# Patient Record
Sex: Male | Born: 1965 | Race: White | Hispanic: No | Marital: Married | Smoking: Never smoker
Health system: Southern US, Community
[De-identification: ages and names within clinical notes are randomized; demographics above are authoritative.]

## PROBLEM LIST (undated history)

## (undated) DIAGNOSIS — Z9289 Personal history of other medical treatment: Secondary | ICD-10-CM

## (undated) DIAGNOSIS — Z8249 Family history of ischemic heart disease and other diseases of the circulatory system: Secondary | ICD-10-CM

## (undated) DIAGNOSIS — N529 Male erectile dysfunction, unspecified: Secondary | ICD-10-CM

## (undated) DIAGNOSIS — E782 Mixed hyperlipidemia: Secondary | ICD-10-CM

## (undated) DIAGNOSIS — J309 Allergic rhinitis, unspecified: Secondary | ICD-10-CM

## (undated) DIAGNOSIS — E669 Obesity, unspecified: Secondary | ICD-10-CM

## (undated) DIAGNOSIS — I10 Essential (primary) hypertension: Secondary | ICD-10-CM

## (undated) DIAGNOSIS — T7840XA Allergy, unspecified, initial encounter: Secondary | ICD-10-CM

## (undated) DIAGNOSIS — L719 Rosacea, unspecified: Secondary | ICD-10-CM

## (undated) DIAGNOSIS — E785 Hyperlipidemia, unspecified: Secondary | ICD-10-CM

## (undated) DIAGNOSIS — E559 Vitamin D deficiency, unspecified: Secondary | ICD-10-CM

## (undated) HISTORY — DX: Family history of ischemic heart disease and other diseases of the circulatory system: Z82.49

## (undated) HISTORY — DX: Vitamin D deficiency, unspecified: E55.9

## (undated) HISTORY — DX: Personal history of other medical treatment: Z92.89

## (undated) HISTORY — DX: Hyperlipidemia, unspecified: E78.5

## (undated) HISTORY — DX: Male erectile dysfunction, unspecified: N52.9

## (undated) HISTORY — DX: Essential (primary) hypertension: I10

## (undated) HISTORY — DX: Mixed hyperlipidemia: E78.2

## (undated) HISTORY — DX: Allergic rhinitis, unspecified: J30.9

## (undated) HISTORY — PX: LUMBAR DISC SURGERY: SHX700

## (undated) HISTORY — DX: Rosacea, unspecified: L71.9

## (undated) HISTORY — DX: Obesity, unspecified: E66.9

## (undated) HISTORY — DX: Allergy, unspecified, initial encounter: T78.40XA

---

## 1998-12-08 ENCOUNTER — Ambulatory Visit (HOSPITAL_COMMUNITY): Admission: RE | Admit: 1998-12-08 | Discharge: 1998-12-08 | Payer: Self-pay | Admitting: Cardiology

## 1998-12-08 ENCOUNTER — Encounter: Payer: Self-pay | Admitting: Cardiology

## 2003-02-10 ENCOUNTER — Encounter: Payer: Self-pay | Admitting: Family Medicine

## 2003-02-10 ENCOUNTER — Encounter: Admission: RE | Admit: 2003-02-10 | Discharge: 2003-02-10 | Payer: Self-pay | Admitting: Family Medicine

## 2003-09-15 ENCOUNTER — Encounter: Admission: RE | Admit: 2003-09-15 | Discharge: 2003-09-15 | Payer: Self-pay | Admitting: Family Medicine

## 2006-04-15 ENCOUNTER — Ambulatory Visit: Payer: Self-pay | Admitting: Family Medicine

## 2006-06-03 ENCOUNTER — Ambulatory Visit: Payer: Self-pay | Admitting: Family Medicine

## 2006-08-20 ENCOUNTER — Ambulatory Visit: Payer: Self-pay | Admitting: Family Medicine

## 2006-09-03 ENCOUNTER — Ambulatory Visit (HOSPITAL_COMMUNITY): Admission: RE | Admit: 2006-09-03 | Discharge: 2006-09-04 | Payer: Self-pay | Admitting: Neurosurgery

## 2007-11-11 ENCOUNTER — Ambulatory Visit: Payer: Self-pay | Admitting: Family Medicine

## 2008-02-02 ENCOUNTER — Ambulatory Visit (HOSPITAL_COMMUNITY): Admission: RE | Admit: 2008-02-02 | Discharge: 2008-02-03 | Payer: Self-pay | Admitting: Neurosurgery

## 2009-11-18 ENCOUNTER — Ambulatory Visit: Payer: Self-pay | Admitting: Family Medicine

## 2009-12-16 DIAGNOSIS — Z9289 Personal history of other medical treatment: Secondary | ICD-10-CM

## 2009-12-16 HISTORY — DX: Personal history of other medical treatment: Z92.89

## 2011-02-20 NOTE — Op Note (Signed)
NAMEFINLEY, Patrick Byrd           ACCOUNT NO.:  [id number]   MEDICAL RECORD NO.:  [id number]          PATIENT TYPE:  OIB   LOCATION:  3533                         FACILITY:  [facility]   PHYSICIAN:  Clydene Fake, M.D.  DATE OF BIRTH:  Oct 16, 1965   DATE OF PROCEDURE:  02/02/2008  DATE OF DISCHARGE:                               OPERATIVE REPORT   PREOPERATIVE DIAGNOSIS:  Herniated nucleus pulposus, spondylosis, right  L4.   POSTOPERATIVE DIAGNOSIS:  Herniated nucleus pulposus,  spondylosis,  right L4.   PROCEDURE:  Right L4-L5 semi-hemilaminectomy and discectomy,  microdissection with microscope.   SURGEON:  Clydene Fake, M.D.   ASSISTANT:  Stefani Dama, M.D.   ANESTHESIA:  General endotracheal tube anesthesia.   ESTIMATED BLOOD LOSS:  Minimal.   DRAINS:  None.   COMPLICATIONS:  None.   REASON FOR PROCEDURE:  The patient is a 45 year old gentleman who has  had back and right leg pain and numbness improving slightly with  __________ but not lasting.  Symptoms have been progressing.  He had a  prior left-sided discectomy at 4-5 level.  An MRI was done showing  central to right-sided disc herniation.   PROCEDURE IN DETAIL:  The patient was brought to the operating room,  general anesthesia was induced.  The patient was placed in the prone  position on a Wilson frame with all pressure points padded.  The patient  was prepped and draped with sterile technique.  The site of  incision  was injected with 10 mL of 1% lidocaine with epinephrine.  An incision  was then made at the site of the previous scar in the midline over the  lumbar spine, incision taken down to the lumbar fascia.  Hemostasis was  obtained with Bovie cauterization.  The fascia was incised on the right  side and subperiosteal dissection was done at the L4-L5 spinous process  and lamina down to the facets.  The self retaining retractor was placed  and marker was placed in the interspace.  X-ray was obtained  confirming  our positioning at L4-L5.  Microscope was brought in for  microdissection.  At this point high-speed drill was used to start semi-  hemilaminectomy and medial facetectomy.  It was completed with Kerrison  punches and ligamentum flavum was removed.  Foraminotomy was done over  the L5 root to explore the epidural space.  Extruded fragment of disk  found up under the L5 root.  This was removed. As we explored the  epidural space, we found a large linear rent.  The disk space was then  incised with a 15-blade  and discectomy continued with pituitary  rongeurs and curettes.  When we were finished, we had good decompression  of the central canal and in L5 nerve root, we used osteotome to remove  some osteophytes under the L5 root hoping to decompress it further.  We  irrigated with antibiotic solution, we  got hemostasis with Gelfoam and  Thrombin.  This was irrigated out.  We checked the nerve root and we had  good decompression of the L4 and L5 roots and the central  canal.  The  retractors were removed.  Fascia was closed with 0-Vicryl interrupted sutures, subcutaneous tissue  closed with same, skin closed with Benzoin and Steri-Strips dressings  was applied.  The patient was placed back in the supine position, awoken  from anesthesia, and transferred to the recovery room in stable  condition.           ______________________________  Clydene Fake, M.D.     JRH/MEDQ  D:  02/02/2008  T:  02/03/2008  Job:  [id number]

## 2011-02-23 NOTE — Op Note (Signed)
Patrick Byrd, Patrick Byrd           ACCOUNT NO.:  [id number]   MEDICAL RECORD NO.:  [id number]          PATIENT TYPE:  AMB   LOCATION:  SDS                          FACILITY:  [facility]   PHYSICIAN:  Clydene Fake, M.D.  DATE OF BIRTH:  [birth year]   DATE OF PROCEDURE:  09/03/2006  DATE OF DISCHARGE:                                 OPERATIVE REPORT   DIAGNOSES:  Herniated nucleus pulposus, spondylosis, L4-5, and left-sided  radiculopathy.   POSTOPERATIVE DIAGNOSES:  Herniated nucleus pulposus, spondylosis, L4-5, and  left-sided radiculopathy.   PROCEDURE:  L4-5 semihemilaminectomy and diskectomy, microdissection with  the microscope.   SURGEON:  Clydene Fake, M.D.   ASSISTANT:  Hewitt Shorts, M.D.   ANESTHESIA:  General endotracheal tube anesthesia.   ESTIMATED BLOOD LOSS:  Minimal.   BLOOD GIVEN:  None.   DRAINS:  None.   COMPLICATIONS:  None.   REASON FOR PROCEDURE:  Patient is a 45 year old gentleman, who has had back  and left leg pain, numbness, that has been going on 4 to 5 weeks or so.  An  MRI was done, showing spondylytic changes, worse at the 4-5 level.  Huge  free fragments of disk herniation coming central, and an annular tear, going  more to the left side, causing canal stenosis and compression of the left-  sided nerve root.  The patient had decreased sensation, left S1 and L4  distribution.  Positive straight leg raise.  Patient continued to have  problems despite a steroids and other nonsurgical management.  Patient  brought to surgery for diskectomy and decompression of the nerve roots.   PROCEDURE IN DETAIL:  Patient was brought to the operating room.  After  general anesthesia was induced, the patient was placed in a prone position  on a Wilson frame with all pressure points padded.  The patient was prepped  and draped in a sterile fashion.  The site of incision was injected with 10  cc of 1% lidocaine with epinephrine.  A needle was placed in  the  intervertebral interspace.  X-ray was obtained and the x-ray showed we were  pointing towards the 4-5 spinous process.  Incision was then made centered  over where the needle was.  The incision was taken down to the fascia.  Hemostasis was obtained with Bovie cauterization.  The fascia was incised on  the left side and subperiosteal dissection performed over the L4 spinous  processes and lamina out to the facet.  A marker was placed in the  interspace and another x-ray was obtained, confirming our positioning at L4-  5.  The microscope was brought in for microdissection at this point, and a  high-speed drill was used to start a semihemilaminectomy and a medial  facetectomy, and was continued and completed with Kerrison punches.  Ligamentum flavum was removed, and we removed the top of the L5 lamina and  did a foraminotomy over the L5 roots.  We explored the epidural space with  nerve hooks and found the disk bulging below this on the body of L5, a large  epidural mass.  As we carefully dissected  that out, it was a large disk  herniation, and we were able to remove this huge free fragment, removing  this with hooks and pituitary rongeurs.  After this, the dura was relaxed.  There was no further compression of the nerve roots or the central canal.  We explored the disk space and there was an annular bulge and possible tear  palpated with the nerve hook very medially, maybe more towards the right  side, but no hole here on the left side where we could visualize.  It was  felt best to leave the disk space intact, since there were decompression of  the canal and the left-sided nerve root.  Hemostasis was obtained with  Gelfoam and thrombin and bipolar cauterization.  The Gelfoam was irrigated  out.  We irrigated with antibiotic solution.  There was good hemostasis and  the retractors were removed.  Fascia was closed with 0 Vicryl interrupted  sutures.  The subcutaneous tissues were closed  with 0, 2-0 and 3-0 Vicryl  interrupted sutures, and the skin closed with benzoin and Steri-Strips.  A  dressing was placed.  The patient was placed back into a supine position,  awakened from anesthesia and transferred to the recovery room in stable  condition.           ______________________________  Clydene Fake, M.D.     JRH/MEDQ  D:  09/03/2006  T:  09/03/2006  Job:  [id number]

## 2011-06-15 ENCOUNTER — Encounter: Payer: Self-pay | Admitting: Family Medicine

## 2011-07-03 LAB — URINALYSIS, ROUTINE W REFLEX MICROSCOPIC
Bilirubin Urine: NEGATIVE
Glucose, UA: NEGATIVE
Hgb urine dipstick: NEGATIVE
Ketones, ur: NEGATIVE
Nitrite: NEGATIVE
Protein, ur: NEGATIVE
Specific Gravity, Urine: 1.034 — ABNORMAL HIGH
Urobilinogen, UA: 0.2
pH: 5.5

## 2011-07-03 LAB — CBC
HCT: 45.8
Hemoglobin: 15.5
MCHC: 33.9
MCV: 90.7
Platelets: 262
RBC: 5.05
RDW: 13.1
WBC: 8

## 2011-07-03 LAB — BASIC METABOLIC PANEL
BUN: 24 — ABNORMAL HIGH
CO2: 26
Calcium: 10.3
Chloride: 105
Creatinine, Ser: 0.91
GFR calc Af Amer: >60
GFR calc non Af Amer: >60
Glucose, Bld: 98
Potassium: 4.5
Sodium: 140

## 2011-07-03 LAB — PROTIME-INR
INR: 0.9
Prothrombin Time: 12.1

## 2011-07-03 LAB — APTT: aPTT: 28

## 2012-03-17 ENCOUNTER — Ambulatory Visit: Payer: Self-pay

## 2013-01-18 ENCOUNTER — Encounter (HOSPITAL_COMMUNITY): Payer: Self-pay | Admitting: *Deleted

## 2013-01-18 ENCOUNTER — Emergency Department (HOSPITAL_COMMUNITY)
Admission: EM | Admit: 2013-01-18 | Discharge: 2013-01-18 | Disposition: A | Discharge status: Home / Self Care | Payer: BC Managed Care – PPO | Attending: Emergency Medicine | Admitting: Emergency Medicine

## 2013-01-18 DIAGNOSIS — Y9389 Activity, other specified: Secondary | ICD-10-CM | POA: Insufficient documentation

## 2013-01-18 DIAGNOSIS — Z862 Personal history of diseases of the blood and blood-forming organs and certain disorders involving the immune mechanism: Secondary | ICD-10-CM | POA: Insufficient documentation

## 2013-01-18 DIAGNOSIS — Z87448 Personal history of other diseases of urinary system: Secondary | ICD-10-CM | POA: Insufficient documentation

## 2013-01-18 DIAGNOSIS — Z23 Encounter for immunization: Secondary | ICD-10-CM | POA: Insufficient documentation

## 2013-01-18 DIAGNOSIS — Z7982 Long term (current) use of aspirin: Secondary | ICD-10-CM | POA: Insufficient documentation

## 2013-01-18 DIAGNOSIS — S61209A Unspecified open wound of unspecified finger without damage to nail, initial encounter: Secondary | ICD-10-CM | POA: Insufficient documentation

## 2013-01-18 DIAGNOSIS — Y929 Unspecified place or not applicable: Secondary | ICD-10-CM | POA: Insufficient documentation

## 2013-01-18 DIAGNOSIS — E669 Obesity, unspecified: Secondary | ICD-10-CM | POA: Insufficient documentation

## 2013-01-18 DIAGNOSIS — Z8639 Personal history of other endocrine, nutritional and metabolic disease: Secondary | ICD-10-CM | POA: Insufficient documentation

## 2013-01-18 DIAGNOSIS — W260XXA Contact with knife, initial encounter: Secondary | ICD-10-CM | POA: Insufficient documentation

## 2013-01-18 DIAGNOSIS — S61219A Laceration without foreign body of unspecified finger without damage to nail, initial encounter: Secondary | ICD-10-CM

## 2013-01-18 DIAGNOSIS — Z872 Personal history of diseases of the skin and subcutaneous tissue: Secondary | ICD-10-CM | POA: Insufficient documentation

## 2013-01-18 MED ORDER — TETANUS-DIPHTH-ACELL PERTUSSIS 5-2.5-18.5 LF-MCG/0.5 IM SUSP
0.5000 mL | Freq: Once | INTRAMUSCULAR | Status: AC
  Administered 2013-01-18: 0.5 mL via INTRAMUSCULAR
  Filled 2013-01-18: qty 0.5

## 2013-01-18 NOTE — ED Provider Notes (Signed)
History    This chart was scribed for non-physician practitioner working with Shelda Jakes, MD by Frederik Pear, ED Scribe. This patient was seen in room TR10C/TR10C and the patient's care was started at 2120.   CSN: [id number]  Arrival date & time 01/18/13  2001   First MD Initiated Contact with Patient 01/18/13 2120      Chief Complaint  Patient presents with  . Extremity Laceration    (Consider location/radiation/quality/duration/timing/severity/associated sxs/prior treatment) The history is provided by the patient and medical records. No language interpreter was used.    Patrick Byrd is a 47 y.o. male who presents to the Emergency Department with a chief complaint of sudden onset, constant, moderate laceration with 5/10 pain to the left index that began at 19:30 when he cut his finger with a knife while he was preparing lobster for dinner. In ED, the bleeding is controlled. He denies any allergies to medications.   Past Medical History  Diagnosis Date  . Dyslipidemia   . ED (erectile dysfunction)   . Obesity   . Rosacea   . Hypercholesteremia     Past Surgical History  Procedure Laterality Date  . Lumbar disc surgery      [facility]    Family History  Problem Relation Age of Onset  . Hypertension Father   . Arthritis Sister   . Cancer Paternal Grandmother   . Arthritis Paternal Grandmother   . Stroke Paternal Grandfather     History  Substance Use Topics  . Smoking status: Never Smoker   . Smokeless tobacco: Not on file  . Alcohol Use: Yes     Comment: occ      Review of Systems A complete 10 system review of systems was obtained and all systems are negative except as noted in the HPI and PMH.   Allergies  Review of patient's allergies indicates no known allergies.  Home Medications   Current Outpatient Rx  Name  Route  Sig  Dispense  Refill  . aspirin 81 MG tablet   Oral   Take 81 mg by mouth daily.           . benzoyl peroxide 5 %  gel   Topical   Apply topically daily.           . Multiple Vitamins-Minerals (MULTIVITAMIN WITH MINERALS) tablet   Oral   Take 1 tablet by mouth daily.             BP 148/86  Pulse 86  Temp(Src) 98.3 F (36.8 C) (Oral)  Resp 16  SpO2 96%  Physical Exam  Nursing note and vitals reviewed. Constitutional: He is oriented to person, place, and time. He appears well-developed and well-nourished. No distress.  HENT:  Head: Normocephalic and atraumatic.  Eyes: EOM are normal. Pupils are equal, round, and reactive to light.  Neck: Normal range of motion. Neck supple. No tracheal deviation present.  Cardiovascular: Normal rate.   Pulmonary/Chest: Effort normal. No respiratory distress.  Abdominal: Soft. He exhibits no distension.  Musculoskeletal: Normal range of motion. He exhibits no edema.  Neurological: He is alert and oriented to person, place, and time.  Skin: Skin is warm and dry. Laceration noted.  Left index finger 1 cm laceration to the lateral aspect of the DIP.  Psychiatric: He has a normal mood and affect. His behavior is normal.    ED Course  Procedures (including critical care time)  DIAGNOSTIC STUDIES: Oxygen Saturation is 96% on room air, adequate by  my interpretation.    COORDINATION OF CARE:  21:27- Discussed planned course of treatment with the patient, including repairing the laceration and a TDap, who is agreeable at this time.  21:45- Medication Orders- TDap (Boostrix) injection 0.27mL-once.  LACERATION REPAIR Performed by: Ivar Drape PA-C Consent: Verbal consent obtained. Risks and benefits: risks, benefits and alternatives were discussed Patient identity confirmed: provided demographic data Time out performed prior to procedure Prepped and Draped in normal sterile fashion Wound explored Laceration Location: left index finger to the lateral aspect of the DIP Laceration Length: 1 cm No Foreign Bodies seen or palpated Anesthesia: local  infiltration Local anesthetic: lidocaine 2% without epinephrine Anesthetic total: 3 ml Irrigation method: syringe Amount of cleaning: standard Skin closure: 4-0 gut Number of sutures or staples: 4 Technique: simple Patient tolerance: Patient tolerated the procedure well with no immediate complications.   Labs Reviewed - No data to display No results found.   1. Laceration of finger, initial encounter       MDM    I personally performed the services described in this documentation, which was scribed in my presence. The recorded information has been reviewed and is accurate.         Roxy Horseman, PA-C 01/19/13 [phone]

## 2013-01-18 NOTE — ED Notes (Signed)
Pt states he was preparing lobster for dinner and cut his left pointer finger on the shell approx 25 min PTA.  Bleeding controlled at this time.

## 2013-01-20 NOTE — ED Provider Notes (Signed)
Medical screening examination/treatment/procedure(s) were performed by non-physician practitioner and as supervising physician I was immediately available for consultation/collaboration.   Shelda Jakes, MD 01/20/13 1311

## 2013-03-25 ENCOUNTER — Encounter: Payer: Self-pay | Admitting: Medical

## 2013-03-25 ENCOUNTER — Other Ambulatory Visit: Payer: Self-pay | Admitting: Medical

## 2013-03-25 ENCOUNTER — Other Ambulatory Visit: Payer: Self-pay | Admitting: Cardiovascular Disease

## 2013-03-25 ENCOUNTER — Ambulatory Visit (INDEPENDENT_AMBULATORY_CARE_PROVIDER_SITE_OTHER): Payer: BC Managed Care – PPO | Admitting: Medical

## 2013-03-25 VITALS — BP 120/80 | HR 80 | Temp 97.3°F | Resp 16 | Ht 74.0 in | Wt 241.0 lb

## 2013-03-25 DIAGNOSIS — E559 Vitamin D deficiency, unspecified: Secondary | ICD-10-CM

## 2013-03-25 DIAGNOSIS — Z8249 Family history of ischemic heart disease and other diseases of the circulatory system: Secondary | ICD-10-CM

## 2013-03-25 DIAGNOSIS — Z0000 Encounter for general adult medical examination without abnormal findings: Secondary | ICD-10-CM

## 2013-03-25 DIAGNOSIS — E782 Mixed hyperlipidemia: Secondary | ICD-10-CM

## 2013-03-25 DIAGNOSIS — E669 Obesity, unspecified: Secondary | ICD-10-CM

## 2013-03-25 LAB — COMPREHENSIVE METABOLIC PANEL
ALT: 30 U/L (ref 0–53)
AST: 25 U/L (ref 0–37)
Albumin: 4.5 g/dL (ref 3.5–5.2)
Alkaline Phosphatase: 79 U/L (ref 39–117)
BUN: 14 mg/dL (ref 6–23)
CO2: 25 mEq/L (ref 19–32)
Calcium: 9.6 mg/dL (ref 8.4–10.5)
Chloride: 103 mEq/L (ref 96–112)
Creat: 0.98 mg/dL (ref 0.50–1.35)
Glucose, Bld: 89 mg/dL (ref 70–99)
Potassium: 4.2 mEq/L (ref 3.5–5.3)
Sodium: 137 mEq/L (ref 135–145)
Total Bilirubin: 0.9 mg/dL (ref 0.3–1.2)
Total Protein: 7.2 g/dL (ref 6.0–8.3)

## 2013-03-25 LAB — CBC
HCT: 44.1 % (ref 39.0–52.0)
Hemoglobin: 15.2 g/dL (ref 13.0–17.0)
MCH: 30.1 pg (ref 26.0–34.0)
MCHC: 34.5 g/dL (ref 30.0–36.0)
MCV: 87.3 fL (ref 78.0–100.0)
Platelets: 251 10*3/uL (ref 150–400)
RBC: 5.05 MIL/uL (ref 4.22–5.81)
RDW: 13.6 % (ref 11.5–15.5)
WBC: 6.3 10*3/uL (ref 4.0–10.5)

## 2013-03-25 MED ORDER — SILDENAFIL CITRATE 100 MG PO TABS: 100.0000 mg | ORAL_TABLET | Freq: Every day | ORAL | Status: DC | PRN

## 2013-03-25 NOTE — Progress Notes (Signed)
Subjective:   HPI  Patrick Byrd is a 47 y.o. male who presents for a complete physical.  Sees cardiology given strong family history of heart disease.  Is on medication for high cholesterol.  No prior personal hx/o heart disease, but he has had cardiac testing prior.  Preventative care: Last ophthalmology visit:n/a Last dental visit:yes- Dr. Garner Nash Last colonoscopy:n/a Last prostate exam:  Last EKG:11/11/2007  Prior vaccinations: TD or Tdap:01/18/2013 Influenza:n/a Pneumococcal:n/a Shingles/Zostavax:n/a  Advanced directive:n/a Health care power of attorney:n/a Living will:n/a  Concerns: None, needs viagra refilled, labs.  Does well on Viagra.    Past Medical History  Diagnosis Date  . ED (erectile dysfunction)   . Obesity   . Rosacea     sees dermatology  . Allergic rhinitis   . Family history of premature CAD   . History of cardiovascular stress test 12/16/09    Myoview, nonischemic; Dr. Allyson Sabal  . Mixed dyslipidemia   . Vitamin D deficiency     Past Surgical History  Procedure Laterality Date  . Lumbar disc surgery      [facility]    Family History  Problem Relation Age of Onset  . Hypertension Father   . Heart disease Father 63    CABG  . Arthritis Sister   . Cancer Paternal Grandmother     brain  . Arthritis Paternal Grandmother   . Stroke Paternal Grandfather   . Cancer Mother     skin  . Diabetes Paternal Aunt   . Heart disease Paternal Uncle 27    MI  . Heart disease Paternal Uncle 26    MI    History   Social History  . Marital Status: Married    Spouse Name: N/A    Number of Children: N/A  . Years of Education: N/A   Occupational History  . Not on file.   Social History Main Topics  . Smoking status: Never Smoker   . Smokeless tobacco: Not on file  . Alcohol Use: 0.0 oz/week    0 Shots of liquor per week     Comment: occ  . Drug Use: No  . Sexually Active: Not on file   Other Topics Concern  . 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  Exercise - walk, cycling some.      Current Outpatient Prescriptions on File Prior to Visit  Medication Sig Dispense Refill  . aspirin 81 MG tablet Take 81 mg by mouth daily.        Marland Kitchen glucosamine-chondroitin 500-400 MG tablet Take 1 tablet by mouth 3 (three) times daily.      . Multiple Vitamins-Minerals (MULTIVITAMIN WITH MINERALS) tablet Take 1 tablet by mouth daily.        Marland Kitchen omega-3 acid ethyl esters (LOVAZA) 1 G capsule Take 1 g by mouth daily.      . rosuvastatin (CRESTOR) 10 MG tablet Take 10 mg by mouth daily.       No current facility-administered medications on file prior to visit.    No Known Allergies   Reviewed their medical, surgical, family, social, medication, and allergy history and updated chart as appropriate.    Review of Systems Constitutional: -fever, -chills, -sweats, -unexpected weight change, -decreased appetite, -fatigue Allergy: -sneezing, -itching, -congestion Dermatology: -changing moles, --rash, -lumps ENT: -runny nose, -ear pain, -sore throat, -hoarseness, -sinus pain, -teeth pain, - ringing in ears, -hearing loss, -nosebleeds Cardiology: -chest pain, -palpitations, -  swelling, -difficulty breathing when lying flat, -waking up short of breath Respiratory: -cough, -shortness of breath, -difficulty breathing with exercise or exertion, -wheezing, -coughing up blood Gastroenterology: -abdominal pain, -nausea, -vomiting, -diarrhea, -constipation, -blood in stool, -changes in bowel movement, -difficulty swallowing or eating Hematology: -bleeding, -bruising  Musculoskeletal: -joint aches, -muscle aches, -joint swelling, -back pain, -neck pain, -cramping, -changes in gait Ophthalmology: denies vision changes, eye redness, itching, discharge Urology: -burning with urination, -difficulty urinating, -blood in urine, -urinary frequency, -urgency, -incontinence Neurology:  -headache, -weakness, -tingling, -numbness, -memory loss, -falls, -dizziness Psychology: -depressed mood, -agitation, -sleep problems     Objective:   Physical Exam  Filed Vitals:   03/25/13 0933  BP: 120/80  Pulse: 80  Temp: 97.3 F (36.3 C)  Resp: 16    General appearance: alert, no distress, WD/WN, white male Skin: scattered benign appearing lesions, no worrisome lesions HEENT: normocephalic, conjunctiva/corneas normal, sclerae anicteric, PERRLA, EOMi, nares patent, no discharge or erythema, pharynx normal Oral cavity: MMM, tongue normal, teeth in good repair Neck: supple, no lymphadenopathy, no thyromegaly, no masses, normal ROM, no bruits Chest: non tender, normal shape and expansion Heart: RRR, normal S1, S2, no murmurs Lungs: CTA bilaterally, no wheezes, rhonchi, or rales Abdomen: +bs, soft, non tender, non distended, no masses, no hepatomegaly, no splenomegaly, no bruits Back: non tender, normal ROM, no scoliosis Musculoskeletal: upper extremities non tender, no obvious deformity, normal ROM throughout, lower extremities non tender, no obvious deformity, normal ROM throughout Extremities: no edema, no cyanosis, no clubbing Pulses: 2+ symmetric, upper and lower extremities, normal cap refill Neurological: alert, oriented x 3, CN2-12 intact, strength normal upper extremities and lower extremities, sensation normal throughout, DTRs 2+ throughout, no cerebellar signs, gait normal Psychiatric: normal affect, behavior normal, pleasant  GU: normal male external genitalia, nontender, no masses, no hernia, no lymphadenopathy Rectal: deferred   Assessment and Plan :      Encounter Diagnoses  Name Primary?  . Routine general medical examination at a health care facility Yes  . Unspecified vitamin D deficiency   . Mixed dyslipidemia   . Family history of premature CAD   . Obesity, unspecified     Physical exam - discussed healthy lifestyle, diet, exercise, preventative  care, vaccinations, and addressed their concerns.  Reviewed prior cardiology records, chart records, updated chart accordingly.   He will take order for labs since he is nonfasting today, and have labs down next month when he sees cardiology for lipo profile.  Advised weight loss, lifestyle changes, routine ophthalmology and dentist and dermatology f/u. Follow-up pending labs

## 2013-03-25 NOTE — Addendum Note (Signed)
Addended by: Jac Canavan on: 03/25/2013 09:38 PM   Modules accepted: Orders

## 2013-03-26 ENCOUNTER — Other Ambulatory Visit: Payer: Self-pay | Admitting: Family Medicine

## 2013-03-26 LAB — POCT URINALYSIS DIPSTICK
Bilirubin, UA: NEGATIVE
Blood, UA: NEGATIVE
Glucose, UA: NEGATIVE
Ketones, UA: NEGATIVE
Leukocytes, UA: NEGATIVE
Nitrite, UA: NEGATIVE
Protein, UA: NEGATIVE
Spec Grav, UA: 1.01
Urobilinogen, UA: NEGATIVE
pH, UA: 5

## 2013-03-26 LAB — NMR LIPOPROFILE WITH LIPIDS
Cholesterol, Total: 173 mg/dL (ref <200)
HDL Particle Number: 42.6 umol/L (ref >=30.5)
HDL Size: 8.9 nm — ABNORMAL LOW (ref >=9.2)
HDL-C: 55 mg/dL (ref >=40)
LDL (calc): 80 mg/dL (ref <100)
LDL Particle Number: 1128 nmol/L — ABNORMAL HIGH (ref <1000)
LDL Size: 20.3 nm — ABNORMAL LOW (ref >20.5)
LP-IR Score: 61 — ABNORMAL HIGH (ref <=45)
Large HDL-P: 4.8 umol/L (ref >=4.8)
Large VLDL-P: 6.3 nmol/L — ABNORMAL HIGH (ref <=2.7)
Small LDL Particle Number: 748 nmol/L — ABNORMAL HIGH (ref <=527)
Triglycerides: 192 mg/dL — ABNORMAL HIGH (ref <150)
VLDL Size: 48 nm — ABNORMAL HIGH (ref <=46.6)

## 2013-03-26 LAB — VITAMIN D 25 HYDROXY (VIT D DEFICIENCY, FRACTURES): Vit D, 25-Hydroxy: 35 ng/mL (ref 30–89)

## 2013-03-26 MED ORDER — SILDENAFIL CITRATE 100 MG PO TABS: 100.0000 mg | ORAL_TABLET | Freq: Every day | ORAL | Status: DC | PRN

## 2013-03-26 NOTE — Telephone Encounter (Signed)
I called over to [facility] and cancelled the Rx for Viagra and re-sent the [facility] [place] per the patients request. CLS

## 2013-04-01 ENCOUNTER — Encounter: Payer: Self-pay | Admitting: Medical

## 2013-04-09 ENCOUNTER — Ambulatory Visit: Payer: Self-pay | Admitting: Medical

## 2013-04-21 ENCOUNTER — Other Ambulatory Visit: Payer: Self-pay | Admitting: *Deleted

## 2013-04-21 MED ORDER — ROSUVASTATIN CALCIUM 10 MG PO TABS: 10.0000 mg | ORAL_TABLET | Freq: Every day | ORAL | Status: DC

## 2013-08-13 ENCOUNTER — Other Ambulatory Visit: Payer: Self-pay

## 2013-10-09 ENCOUNTER — Other Ambulatory Visit: Payer: Self-pay | Admitting: *Deleted

## 2013-10-09 MED ORDER — ROSUVASTATIN CALCIUM 10 MG PO TABS: 10.0000 mg | ORAL_TABLET | Freq: Every day | ORAL | Status: DC

## 2013-11-12 ENCOUNTER — Other Ambulatory Visit: Payer: Self-pay | Admitting: *Deleted

## 2013-11-12 MED ORDER — ROSUVASTATIN CALCIUM 10 MG PO TABS: 10.0000 mg | ORAL_TABLET | Freq: Every day | ORAL | Status: DC

## 2014-06-08 ENCOUNTER — Encounter: Payer: Self-pay | Admitting: *Deleted

## 2014-06-08 ENCOUNTER — Other Ambulatory Visit: Payer: Self-pay | Admitting: *Deleted

## 2014-06-08 ENCOUNTER — Other Ambulatory Visit: Payer: Self-pay

## 2014-06-08 MED ORDER — ROSUVASTATIN CALCIUM 5 MG PO TABS: 5.0000 mg | ORAL_TABLET | Freq: Every day | ORAL | Status: DC

## 2014-06-08 NOTE — Telephone Encounter (Signed)
Rx was sent to pharmacy electronically. 

## 2014-07-03 ENCOUNTER — Encounter: Payer: Self-pay | Admitting: Medical

## 2014-07-08 ENCOUNTER — Telehealth: Payer: Self-pay | Admitting: Cardiovascular Disease

## 2014-07-09 ENCOUNTER — Telehealth: Payer: Self-pay | Admitting: Cardiovascular Disease

## 2014-07-09 NOTE — Telephone Encounter (Signed)
Closed encounter °

## 2014-07-13 MED ORDER — ROSUVASTATIN CALCIUM 10 MG PO TABS: 10.0000 mg | ORAL_TABLET | Freq: Every day | ORAL | Status: DC

## 2014-07-13 NOTE — Telephone Encounter (Signed)
Pt said Patrick Byrd said they still have not heard from you,regarding his cholesterol medicine. Pt did not remember the name of it,please call this in today.Please call to [facility] in [place].

## 2014-07-13 NOTE — Telephone Encounter (Signed)
Rx was sent to pharmacy electronically. Patient states he takes 10mg  crestor.

## 2014-08-03 ENCOUNTER — Encounter: Payer: Self-pay | Admitting: Cardiovascular Disease

## 2014-08-03 ENCOUNTER — Ambulatory Visit (INDEPENDENT_AMBULATORY_CARE_PROVIDER_SITE_OTHER): Payer: BC Managed Care – PPO | Admitting: Cardiovascular Disease

## 2014-08-03 VITALS — BP 132/86 | HR 86 | Ht 74.0 in | Wt 241.4 lb

## 2014-08-03 DIAGNOSIS — Z8249 Family history of ischemic heart disease and other diseases of the circulatory system: Secondary | ICD-10-CM

## 2014-08-03 DIAGNOSIS — Z79899 Other long term (current) drug therapy: Secondary | ICD-10-CM

## 2014-08-03 DIAGNOSIS — E785 Hyperlipidemia, unspecified: Secondary | ICD-10-CM

## 2014-08-03 NOTE — Assessment & Plan Note (Signed)
He is on statin therapy. His last lipid profile performed 03/25/13 revealed LDL of 80. Recheck a lipid and liver profile. He does admit to poor dietary habits.

## 2014-08-03 NOTE — Progress Notes (Signed)
08/03/2014 Patrick Byrd.   [birth year]  [id number]  Primary Physician Wyatt Haste, MD Primary Cardiologist: Lorretta Harp MD Renae Gloss   HPI:  Patrick Byrd is a 48 year old moderately overweight married Caucasian male father of 3 children who I last saw in the office 07/23/12. He has a history of hyperlipidemia and a positive family history of heart disease with a father who had bypass surgery at age 59. He is completely asymptomatic. His last Myoview performed 12/16/09 was nonischemic.   Current Outpatient Prescriptions  Medication Sig Dispense Refill  . Ascorbic Acid (VITAMIN C) 100 MG tablet Take 100 mg by mouth daily.      Marland Kitchen aspirin 81 MG tablet Take 81 mg by mouth daily.      . cetirizine (ZYRTEC) 10 MG tablet Take 10 mg by mouth daily.      Marland Kitchen doxycycline (ADOXA) 50 MG tablet Take 50 mg by mouth. Every other day      . glucosamine-chondroitin 500-400 MG tablet Take 1 tablet by mouth daily.       . Multiple Vitamins-Minerals (MULTIVITAMIN WITH MINERALS) tablet Take 1 tablet by mouth daily.        Marland Kitchen omega-3 acid ethyl esters (LOVAZA) 1 G capsule Take 1 g by mouth daily.      . Omega-3 Fatty Acids (FISH OIL) 1000 MG CAPS Take by mouth.      . rosuvastatin (CRESTOR) 5 MG tablet Take 1 tablet (5 mg total) by mouth daily.  30 tablet  0  . sildenafil (VIAGRA) 100 MG tablet Take 1 tablet (100 mg total) by mouth daily as needed for erectile dysfunction.  10 tablet  5   No current facility-administered medications for this visit.    Allergies  Allergen Reactions  . Statins     History   Social History  . Marital Status: Married    Spouse Name: N/A    Number of Children: N/A  . Years of Education: N/A   Occupational History  . Not on file.   Social History Main Topics  . Smoking status: Unknown If Ever Smoked  . Smokeless tobacco: Not on file  . Alcohol Use: No     Comment: occ  . Drug Use: No  . Sexual Activity: Not on file    Other Topics Concern  . 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  Exercise - walk, cycling some.       Review of Systems: General: negative for chills, fever, night sweats or weight changes.  Cardiovascular: negative for chest pain, dyspnea on exertion, edema, orthopnea, palpitations, paroxysmal nocturnal dyspnea or shortness of breath Dermatological: negative for rash Respiratory: negative for cough or wheezing Urologic: negative for hematuria Abdominal: negative for nausea, vomiting, diarrhea, bright red blood per rectum, melena, or hematemesis Neurologic: negative for visual changes, syncope, or dizziness All other systems reviewed and are otherwise negative except as noted above.    Blood pressure 132/86, pulse 86, height 6\' 2"  (1.88 m), weight 241 lb 6.4 oz (109.498 kg).  General appearance: alert and no distress Neck: no adenopathy, no carotid bruit, no JVD, supple, symmetrical, trachea midline and thyroid not enlarged, symmetric, no tenderness/mass/nodules Lungs: clear to auscultation bilaterally Heart: regular rate and rhythm, S1, S2 normal, no murmur, click, rub or gallop Extremities: extremities normal, atraumatic, no cyanosis or edema  EKG normal sinus rhythm at 86 without ST or T-wave changes  ASSESSMENT AND PLAN:   Hyperlipidemia He is on statin therapy. His last lipid profile performed 03/25/13 revealed LDL of 80. Recheck a lipid and liver profile. He does admit to poor dietary habits.      Lorretta Harp MD FACP,FACC,FAHA, [facility] 08/03/2014 4:55 PM

## 2014-08-03 NOTE — Patient Instructions (Signed)
  We will see you back in follow up in 1 year with Dr Berry.   Dr Berry has ordered: Your physician recommends that you return for a FASTING lipid profile    

## 2014-08-05 LAB — HEPATIC FUNCTION PANEL
ALT: 34 U/L (ref 0–53)
AST: 26 U/L (ref 0–37)
Albumin: 4.5 g/dL (ref 3.5–5.2)
Alkaline Phosphatase: 81 U/L (ref 39–117)
Bilirubin, Direct: 0.1 mg/dL (ref 0.0–0.3)
Indirect Bilirubin: 0.5 mg/dL (ref 0.2–1.2)
Total Bilirubin: 0.6 mg/dL (ref 0.2–1.2)
Total Protein: 6.9 g/dL (ref 6.0–8.3)

## 2014-08-05 LAB — LIPID PANEL
Cholesterol: 182 mg/dL (ref 0–200)
HDL: 42 mg/dL (ref >39)
LDL Cholesterol: 84 mg/dL (ref 0–99)
Total CHOL/HDL Ratio: 4.3 Ratio
Triglycerides: 279 mg/dL — ABNORMAL HIGH (ref <150)
VLDL: 56 mg/dL — ABNORMAL HIGH (ref 0–40)

## 2014-10-18 ENCOUNTER — Other Ambulatory Visit: Payer: Self-pay | Admitting: *Deleted

## 2014-10-18 MED ORDER — ROSUVASTATIN CALCIUM 5 MG PO TABS: 5.0000 mg | ORAL_TABLET | Freq: Every day | ORAL | Status: DC

## 2015-07-12 ENCOUNTER — Other Ambulatory Visit: Payer: Self-pay | Admitting: *Deleted

## 2015-07-12 MED ORDER — ROSUVASTATIN CALCIUM 5 MG PO TABS: 5.0000 mg | ORAL_TABLET | Freq: Every day | ORAL | Status: DC

## 2015-07-18 ENCOUNTER — Telehealth: Payer: Self-pay | Admitting: Cardiovascular Disease

## 2015-07-18 MED ORDER — ROSUVASTATIN CALCIUM 5 MG PO TABS: 5.0000 mg | ORAL_TABLET | Freq: Every day | ORAL | Status: DC

## 2015-07-18 NOTE — Telephone Encounter (Signed)
°  1. Which medications need to be refilled? Crestor NOT THE GENERIC  2. Which pharmacy is medication to be sent to?[facility]  3. Do they need a 30 day or 90 day supply? 90  4. Would they like a call back once the medication has been sent to the pharmacy? Yes

## 2015-07-18 NOTE — Telephone Encounter (Signed)
Returned call to patient.Brand name only crestor refill sent to pharmacy.Follow up appointment scheduled with Dr.Berry 08/09/15 at 9:00 am.

## 2015-08-09 ENCOUNTER — Ambulatory Visit: Payer: Self-pay | Admitting: Cardiovascular Disease

## 2015-09-07 ENCOUNTER — Ambulatory Visit: Payer: BLUE CROSS/BLUE SHIELD | Admitting: Cardiovascular Disease

## 2015-09-27 ENCOUNTER — Ambulatory Visit (INDEPENDENT_AMBULATORY_CARE_PROVIDER_SITE_OTHER): Payer: BLUE CROSS/BLUE SHIELD | Admitting: Cardiovascular Disease

## 2015-09-27 ENCOUNTER — Encounter: Payer: Self-pay | Admitting: Cardiovascular Disease

## 2015-09-27 VITALS — BP 139/90 | HR 71 | Ht 74.0 in | Wt 244.6 lb

## 2015-09-27 DIAGNOSIS — Z1329 Encounter for screening for other suspected endocrine disorder: Secondary | ICD-10-CM

## 2015-09-27 DIAGNOSIS — I10 Essential (primary) hypertension: Secondary | ICD-10-CM | POA: Insufficient documentation

## 2015-09-27 DIAGNOSIS — E785 Hyperlipidemia, unspecified: Secondary | ICD-10-CM | POA: Diagnosis not present

## 2015-09-27 DIAGNOSIS — Z125 Encounter for screening for malignant neoplasm of prostate: Secondary | ICD-10-CM

## 2015-09-27 DIAGNOSIS — Z8249 Family history of ischemic heart disease and other diseases of the circulatory system: Secondary | ICD-10-CM | POA: Diagnosis not present

## 2015-09-27 DIAGNOSIS — Z131 Encounter for screening for diabetes mellitus: Secondary | ICD-10-CM

## 2015-09-27 LAB — COMPREHENSIVE METABOLIC PANEL
ALT: 38 U/L (ref 9–46)
AST: 24 U/L (ref 10–35)
Albumin: 4.3 g/dL (ref 3.6–5.1)
Alkaline Phosphatase: 81 U/L (ref 40–115)
BUN: 14 mg/dL (ref 7–25)
CO2: 25 mmol/L (ref 20–31)
Calcium: 9 mg/dL (ref 8.6–10.3)
Chloride: 104 mmol/L (ref 98–110)
Creat: 0.92 mg/dL (ref 0.70–1.33)
Glucose, Bld: 100 mg/dL — ABNORMAL HIGH (ref 65–99)
Potassium: 4.3 mmol/L (ref 3.5–5.3)
Sodium: 139 mmol/L (ref 135–146)
Total Bilirubin: 0.6 mg/dL (ref 0.2–1.2)
Total Protein: 6.6 g/dL (ref 6.1–8.1)

## 2015-09-27 LAB — LIPID PANEL
Cholesterol: 172 mg/dL (ref 125–200)
HDL: 49 mg/dL (ref >=40)
LDL Cholesterol: 89 mg/dL (ref <130)
Total CHOL/HDL Ratio: 3.5 Ratio (ref <=5.0)
Triglycerides: 170 mg/dL — ABNORMAL HIGH (ref <150)
VLDL: 34 mg/dL — ABNORMAL HIGH (ref <30)

## 2015-09-27 LAB — TSH: TSH: 2.049 u[IU]/mL (ref 0.350–4.500)

## 2015-09-27 NOTE — Assessment & Plan Note (Signed)
History of hyperlipidemia on Crestor. It's been over a year since his last lipid profile. We will we will check a lipid and liver profile this morning.

## 2015-09-27 NOTE — Patient Instructions (Signed)
Medication Instructions:   NO CHANGE  Labwork:  Your physician recommends that you HAVE LAB WORK TODAY  Follow-Up:  Your physician wants you to follow-up in: ONE YEAR WITH DR BERRY You will receive a reminder letter in the mail two months in advance. If you don't receive a letter, please call our office to schedule the follow-up appointment.   If you need a refill on your cardiac medications before your next appointment, please call your pharmacy.    

## 2015-09-27 NOTE — Progress Notes (Signed)
09/27/2015 Patrick Byrd.   03/15/1966  [id number]  Primary Physician Wyatt Haste, MD Primary Cardiologist: Lorretta Harp MD Renae Gloss   HPI:  Patrick Byrd is a 49 year old moderately overweight married Caucasian male father of 3 children who I last saw in the office 08/03/14. 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 He is completely asymptomatic. His last Myoview performed 12/16/09 was nonischemic.   Current Outpatient Prescriptions  Medication Sig Dispense Refill  . Ascorbic Acid (VITAMIN C) 100 MG tablet Take 100 mg by mouth daily.    Marland Kitchen aspirin 81 MG tablet Take 81 mg by mouth daily.    . cetirizine (ZYRTEC) 10 MG tablet Take 10 mg by mouth daily.    Marland Kitchen doxycycline (ADOXA) 50 MG tablet Take 100 mg by mouth. Every other day    . glucosamine-chondroitin 500-400 MG tablet Take 1 tablet by mouth daily.     . Multiple Vitamins-Minerals (MULTIVITAMIN WITH MINERALS) tablet Take 1 tablet by mouth daily.      . Omega-3 Fatty Acids (FISH OIL) 1000 MG CAPS Take by mouth.    . rosuvastatin (CRESTOR) 5 MG tablet Take 1 tablet (5 mg total) by mouth daily. Brand Name Only 90 tablet 0  . sildenafil (VIAGRA) 100 MG tablet Take 1 tablet (100 mg total) by mouth daily as needed for erectile dysfunction. 10 tablet 5   No current facility-administered medications for this visit.    Allergies  Allergen Reactions  . Statins     Social History   Social History  . Marital Status: Married    Spouse Name: N/A  . Number of Children: N/A  . Years of Education: N/A   Occupational History  . Not on file.   Social History Main Topics  . Smoking status: Never Smoker   . Smokeless tobacco: Not on file  . Alcohol Use: No   Comment: occ  . Drug Use: No  . Sexual Activity: Not on file   Other Topics Concern  . 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  Exercise - walk, cycling some.       Review of Systems: General: negative for chills, fever, night sweats or weight changes.  Cardiovascular: negative for chest pain, dyspnea on exertion, edema, orthopnea, palpitations, paroxysmal nocturnal dyspnea or shortness of breath Dermatological: negative for rash Respiratory: negative for cough or wheezing Urologic: negative for hematuria Abdominal: negative for nausea, vomiting, diarrhea, bright red blood per rectum, melena, or hematemesis Neurologic: negative for visual changes, syncope, or dizziness All other systems reviewed and are otherwise negative except as noted above.    Blood pressure 139/90, pulse 71, height 6\' 2"  (1.88 m), weight 244 lb 9.6 oz (110.95 kg).  General appearance: alert and no distress Neck: no adenopathy, no carotid bruit, no JVD, supple, symmetrical, trachea midline and thyroid not enlarged, symmetric, no tenderness/mass/nodules Lungs: clear to auscultation bilaterally Heart: regular rate and rhythm, S1, S2 normal, no murmur, click, rub or gallop Extremities: extremities normal, atraumatic, no cyanosis or edema  EKG normal sinus rhythm at 71 without ST or T-wave changes. I personally  reviewed this EKG  ASSESSMENT AND PLAN:   Hyperlipidemia History of hyperlipidemia on Crestor. It's been over a year since his last lipid profile. We will we will check a lipid and liver profile this morning.  Essential hypertension History of hypertension blood pressure measured at 139/90. He is not on antihypertensive medications.      Lorretta Harp MD FACP,FACC,FAHA, [facility] 09/27/2015 8:12 AM

## 2015-09-27 NOTE — Assessment & Plan Note (Signed)
History of hypertension blood pressure measured at 139/90. He is not on antihypertensive medications.

## 2015-09-28 LAB — PSA: PSA: 0.49 ng/mL (ref <=4.00)

## 2015-09-28 LAB — HEMOGLOBIN A1C
Hgb A1c MFr Bld: 5.5 % (ref <5.7)
Mean Plasma Glucose: 111 mg/dL (ref <117)

## 2015-10-24 ENCOUNTER — Other Ambulatory Visit: Payer: Self-pay | Admitting: Cardiovascular Disease

## 2015-10-24 NOTE — Telephone Encounter (Signed)
Rx request sent to pharmacy.  

## 2016-11-22 ENCOUNTER — Other Ambulatory Visit: Payer: Self-pay | Admitting: Cardiovascular Disease

## 2016-11-22 NOTE — Telephone Encounter (Signed)
Rx(s) sent to pharmacy electronically.  

## 2016-12-26 ENCOUNTER — Encounter: Payer: Self-pay | Admitting: Cardiovascular Disease

## 2016-12-26 ENCOUNTER — Ambulatory Visit (INDEPENDENT_AMBULATORY_CARE_PROVIDER_SITE_OTHER): Payer: BLUE CROSS/BLUE SHIELD | Admitting: Cardiovascular Disease

## 2016-12-26 VITALS — BP 138/88 | HR 76 | Ht 74.0 in | Wt 244.6 lb

## 2016-12-26 DIAGNOSIS — I10 Essential (primary) hypertension: Secondary | ICD-10-CM

## 2016-12-26 DIAGNOSIS — E785 Hyperlipidemia, unspecified: Secondary | ICD-10-CM | POA: Diagnosis not present

## 2016-12-26 DIAGNOSIS — Z8249 Family history of ischemic heart disease and other diseases of the circulatory system: Secondary | ICD-10-CM | POA: Diagnosis not present

## 2016-12-26 LAB — BASIC METABOLIC PANEL WITH GFR
BUN: 12 mg/dL (ref 7–25)
CO2: 28 mmol/L (ref 20–31)
Calcium: 9.6 mg/dL (ref 8.6–10.3)
Chloride: 103 mmol/L (ref 98–110)
Creat: 0.98 mg/dL (ref 0.70–1.33)
GFR, Est African American: >89 mL/min (ref >=60)
GFR, Est Non African American: 88 mL/min (ref >=60)
Glucose, Bld: 94 mg/dL (ref 65–99)
Potassium: 4.6 mmol/L (ref 3.5–5.3)
Sodium: 139 mmol/L (ref 135–146)

## 2016-12-26 LAB — CBC WITH DIFFERENTIAL/PLATELET
Basophils Absolute: 61 cells/uL (ref 0–200)
Basophils Relative: 1 %
Eosinophils Absolute: 244 cells/uL (ref 15–500)
Eosinophils Relative: 4 %
HCT: 45.5 % (ref 38.5–50.0)
Hemoglobin: 15.3 g/dL (ref 13.2–17.1)
Lymphocytes Relative: 36 %
Lymphs Abs: 2196 cells/uL (ref 850–3900)
MCH: 30.7 pg (ref 27.0–33.0)
MCHC: 33.6 g/dL (ref 32.0–36.0)
MCV: 91.4 fL (ref 80.0–100.0)
MPV: 9.7 fL (ref 7.5–12.5)
Monocytes Absolute: 549 cells/uL (ref 200–950)
Monocytes Relative: 9 %
Neutro Abs: 3050 cells/uL (ref 1500–7800)
Neutrophils Relative %: 50 %
Platelets: 248 10*3/uL (ref 140–400)
RBC: 4.98 MIL/uL (ref 4.20–5.80)
RDW: 13.5 % (ref 11.0–15.0)
WBC: 6.1 10*3/uL (ref 3.8–10.8)

## 2016-12-26 LAB — HEPATIC FUNCTION PANEL
ALT: 44 U/L (ref 9–46)
AST: 29 U/L (ref 10–35)
Albumin: 4.4 g/dL (ref 3.6–5.1)
Alkaline Phosphatase: 77 U/L (ref 40–115)
Bilirubin, Direct: 0.1 mg/dL (ref <=0.2)
Indirect Bilirubin: 0.5 mg/dL (ref 0.2–1.2)
Total Bilirubin: 0.6 mg/dL (ref 0.2–1.2)
Total Protein: 6.7 g/dL (ref 6.1–8.1)

## 2016-12-26 LAB — LIPID PANEL
Cholesterol: 166 mg/dL (ref <200)
HDL: 49 mg/dL (ref >40)
LDL Cholesterol: 74 mg/dL (ref <100)
Total CHOL/HDL Ratio: 3.4 Ratio (ref <5.0)
Triglycerides: 215 mg/dL — ABNORMAL HIGH (ref <150)
VLDL: 43 mg/dL — ABNORMAL HIGH (ref <30)

## 2016-12-26 LAB — TSH: TSH: 1.53 mIU/L (ref 0.40–4.50)

## 2016-12-26 LAB — PSA: PSA: 0.4 ng/mL (ref <=4.0)

## 2016-12-26 LAB — T4, FREE: Free T4: 1.2 ng/dL (ref 0.8–1.8)

## 2016-12-26 MED ORDER — CRESTOR 5 MG PO TABS: 5.0000 mg | ORAL_TABLET | Freq: Every day | ORAL | 0 refills | Status: DC

## 2016-12-26 MED ORDER — CRESTOR 5 MG PO TABS: 5.0000 mg | ORAL_TABLET | Freq: Every day | ORAL | 3 refills | Status: DC

## 2016-12-26 NOTE — Progress Notes (Signed)
12/26/2016 Patrick Byrd.   01-06-1966  [id number]  Primary Physician Wyatt Haste, MD Primary Cardiologist: Lorretta Harp MD Renae Gloss  HPI:  Patrick Byrd is a 51 year old moderately overweight married Caucasian male father of 3 children who I last saw in the office 09/27/15. 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 He has a history of hyperlipidemia and a positive family history of heart disease with a father who had bypass surgery at age 79. He is completely asymptomatic. His last Myoview performed 12/16/09 was nonischemic.   Current Outpatient Prescriptions  Medication Sig Dispense Refill  . Ascorbic Acid (VITAMIN C) 100 MG tablet Take 100 mg by mouth daily.    Marland Kitchen aspirin 81 MG tablet Take 81 mg by mouth daily.    . cetirizine (ZYRTEC) 10 MG tablet Take 10 mg by mouth daily.    . CRESTOR 5 MG tablet Take 1 tablet (5 mg total) by mouth daily. <PLEASE MAKE APPOINTMENT FOR REFILLS> 30 tablet 0  . doxycycline (ADOXA) 50 MG tablet Take 100 mg by mouth. Every other day    . glucosamine-chondroitin 500-400 MG tablet Take 1 tablet by mouth daily.     . Multiple Vitamins-Minerals (MULTIVITAMIN WITH MINERALS) tablet Take 1 tablet by mouth daily.      . Omega-3 Fatty Acids (FISH OIL) 1000 MG CAPS Take by mouth.    . sildenafil (VIAGRA) 100 MG tablet Take 1 tablet (100 mg total) by mouth daily as needed for erectile dysfunction. 10 tablet 5   No current facility-administered medications for this visit.     Allergies  Allergen Reactions  . Statins     Social History   Social History  . Marital status: Married    Spouse name: N/A  . Number of children: N/A  . Years of education: N/A   Occupational History  . Not on file.   Social History Main Topics  . Smoking status: Never Smoker  . Smokeless tobacco: Never Used  . Alcohol use No       Comment: occ  . Drug use: No  . Sexual activity: Not on file   Other Topics Concern  . 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  Exercise - walk, cycling some.       Review of Systems: General: negative for chills, fever, night sweats or weight changes.  Cardiovascular: negative for chest pain, dyspnea on exertion, edema, orthopnea, palpitations, paroxysmal nocturnal dyspnea or shortness of breath Dermatological: negative for rash Respiratory: negative for cough or wheezing Urologic: negative for hematuria Abdominal: negative for nausea, vomiting, diarrhea, bright red blood per rectum, melena, or hematemesis Neurologic: negative for visual changes, syncope, or dizziness All other systems reviewed and are otherwise negative except as noted above.    Blood pressure 138/88, pulse 76, height 6\' 2"  (1.88 m), weight 244 lb 9.6 oz (110.9 kg).  General appearance: alert and no distress Neck: no adenopathy, no carotid bruit, no JVD, supple, symmetrical, trachea midline and thyroid not enlarged, symmetric, no tenderness/mass/nodules Lungs: clear to auscultation bilaterally Heart: regular rate and rhythm, S1, S2 normal, no murmur, click, rub or gallop Extremities: extremities normal, atraumatic, no cyanosis or edema  EKG sinus rhythm at 76 without ST  or T-wave changes. I personally reviewed his EKG  ASSESSMENT AND PLAN:   Hyperlipidemia History of hyperlipidemia on low-dose Crestor. We will recheck a lipid profile  Essential hypertension History of hypertension blood pressure measured at 130/88. He is not on antihypertensive medications.      Lorretta Harp MD FACP,FACC,FAHA, [facility] 12/26/2016 9:19 AM

## 2016-12-26 NOTE — Patient Instructions (Signed)
Medication Instructions: Your physician recommends that you continue on your current medications as directed. Please refer to the Current Medication list given to you today.  Refilled Crestor 90 day with 3 refills.   Labwork: Your physician recommends that you return for lab work: Today--fasting   Follow-Up: Your physician wants you to follow-up in: 1 year with Dr. Gwenlyn Found. You will receive a reminder letter in the mail two months in advance. If you don't receive a letter, please call our office to schedule the follow-up appointment.  If you need a refill on your cardiac medications before your next appointment, please call your pharmacy.

## 2016-12-26 NOTE — Assessment & Plan Note (Signed)
History of hyperlipidemia on low-dose Crestor. We will recheck a lipid profile

## 2016-12-26 NOTE — Assessment & Plan Note (Signed)
History of hypertension blood pressure measured at 130/88. He is not on antihypertensive medications.

## 2016-12-27 ENCOUNTER — Encounter: Payer: Self-pay | Admitting: Medical

## 2016-12-27 LAB — HEMOGLOBIN A1C
Hgb A1c MFr Bld: 5.1 % (ref <5.7)
Mean Plasma Glucose: 100 mg/dL

## 2017-01-01 ENCOUNTER — Encounter: Payer: Self-pay | Admitting: Family Medicine

## 2017-01-01 ENCOUNTER — Ambulatory Visit (INDEPENDENT_AMBULATORY_CARE_PROVIDER_SITE_OTHER): Payer: BLUE CROSS/BLUE SHIELD | Admitting: Family Medicine

## 2017-01-01 VITALS — BP 124/76 | HR 100 | Ht 74.0 in | Wt 245.0 lb

## 2017-01-01 DIAGNOSIS — J309 Allergic rhinitis, unspecified: Secondary | ICD-10-CM

## 2017-01-01 DIAGNOSIS — E785 Hyperlipidemia, unspecified: Secondary | ICD-10-CM

## 2017-01-01 DIAGNOSIS — N529 Male erectile dysfunction, unspecified: Secondary | ICD-10-CM

## 2017-01-01 DIAGNOSIS — E669 Obesity, unspecified: Secondary | ICD-10-CM | POA: Diagnosis not present

## 2017-01-01 DIAGNOSIS — Z8249 Family history of ischemic heart disease and other diseases of the circulatory system: Secondary | ICD-10-CM

## 2017-01-01 DIAGNOSIS — L570 Actinic keratosis: Secondary | ICD-10-CM | POA: Insufficient documentation

## 2017-01-01 DIAGNOSIS — Z0000 Encounter for general adult medical examination without abnormal findings: Secondary | ICD-10-CM | POA: Diagnosis not present

## 2017-01-01 DIAGNOSIS — Z9889 Other specified postprocedural states: Secondary | ICD-10-CM | POA: Insufficient documentation

## 2017-01-01 DIAGNOSIS — Z1211 Encounter for screening for malignant neoplasm of colon: Secondary | ICD-10-CM

## 2017-01-01 DIAGNOSIS — L719 Rosacea, unspecified: Secondary | ICD-10-CM

## 2017-01-01 LAB — POCT URINALYSIS DIPSTICK
Bilirubin, UA: NEGATIVE
Blood, UA: NEGATIVE
Glucose, UA: NEGATIVE
Ketones, UA: NEGATIVE
Leukocytes, UA: NEGATIVE
Nitrite, UA: NEGATIVE
Protein, UA: NEGATIVE
Spec Grav, UA: 1.015 (ref 1.030–1.035)
Urobilinogen, UA: NEGATIVE (ref ?–2.0)
pH, UA: 6 (ref 5.0–8.0)

## 2017-01-01 NOTE — Progress Notes (Signed)
Subjective:    Patient ID: Patrick Grout., male    DOB: 26-Sep-1966, 51 y.o.   MRN: [id number]  HPI He is here for complete examination. He has not been seen in over 3 years. He was recently seen by cardiology. His Crestor was renewed. He presently is on no medications for his blood pressure. There is a family history of heart disease. He also sees dermatology for treatment of rosacea and also actinic keratoses. Does have a history of back surgery and does use glucosamine and chondroitin on a regular basis whether he has pain or not. He does occasionally use Viagra but does not need a refill. His allergies seem to be under good control with Zyrtec. He does not exercise regularly. His dietary habits are dictated by his work schedule. He does travel a lot. His home life is going well. He has no other concerns or complaints. Family and social history as well as health maintenance and immunizations was reviewed.  Review of Systems  All other systems reviewed and are negative.      Objective:   Physical Exam BP 124/76   Pulse 100   Ht 6\' 2"  (1.88 m)   Wt 245 lb (111.1 kg)   SpO2 97%   BMI 31.46 kg/m   General Appearance:    Alert, cooperative, no distress, appears stated age  Head:    Normocephalic, without obvious abnormality, atraumatic  Eyes:    PERRL, conjunctiva/corneas clear, EOM's intact, fundi    benign  Ears:    Normal TM's and external ear canals  Nose:   Nares normal, mucosa normal, no drainage or sinus   tenderness  Throat:   Lips, mucosa, and tongue normal; teeth and gums normal  Neck:   Supple, no lymphadenopathy;  thyroid:  no   enlargement/tenderness/nodules; no carotid   bruit or JVD     Lungs:     Clear to auscultation bilaterally without wheezes, rales or     ronchi; respirations unlabored      Heart:    Regular rate and rhythm, S1 and S2 normal, no murmur, rub   or gallop  Breast Exam:    No chest wall tenderness, masses or gynecomastia  Abdomen:      Soft, non-tender, nondistended, normoactive bowel sounds,    no masses, no hepatosplenomegaly  Genitalia:  Deferred   Rectal:   deferred  Extremities:   No clubbing, cyanosis or edema  Pulses:   2+ and symmetric all extremities  Skin:   Skin color, texture, turgor normal, no rashes or lesions  Lymph nodes:   Cervical, supraclavicular, and axillary nodes normal  Neurologic:   CNII-XII intact, normal strength, sensation and gait; reflexes 2+ and symmetric throughout          Psych:   Normal mood, affect, hygiene and grooming.         Assessment & Plan:  Routine general medical examination at a health care facility - Plan: POCT Urinalysis Dipstick  Hyperlipidemia, unspecified hyperlipidemia type  Family history of early CAD  Obesity (BMI 30.0-34.9)  Rosacea  Actinic keratosis  Screening for colon cancer - Plan: Ambulatory referral to Gastroenterology  Chronic allergic rhinitis, unspecified seasonality, unspecified trigger  History of back surgery  Erectile dysfunction, unspecified erectile dysfunction type  He will continue on Crestor. He will be seen by cardiology on a yearly basis. Plans to continue on his antibiotic and to be seen by dermatology. Colonoscopy will be scheduled. Discussed diet and exercise with  him in detail. Encouraged him to get involved in a regular exercise program as well as cutting back on carbohydrates. I she will use Tylenol for aches and pains and also talked about proper back rehabilitation

## 2017-01-02 ENCOUNTER — Encounter: Payer: Self-pay | Admitting: Gastroenterology

## 2017-01-23 ENCOUNTER — Encounter: Payer: Self-pay | Admitting: Medical

## 2017-02-05 HISTORY — PX: COLONOSCOPY: SHX174

## 2017-02-19 ENCOUNTER — Ambulatory Visit (AMBULATORY_SURGERY_CENTER): Payer: Self-pay

## 2017-02-19 ENCOUNTER — Encounter: Payer: Self-pay | Admitting: Gastroenterology

## 2017-02-19 VITALS — Ht 74.0 in | Wt 250.0 lb

## 2017-02-19 DIAGNOSIS — Z1211 Encounter for screening for malignant neoplasm of colon: Secondary | ICD-10-CM

## 2017-02-19 MED ORDER — SUPREP BOWEL PREP KIT 17.5-3.13-1.6 GM/177ML PO SOLN: 1.0000 | Freq: Once | ORAL | 0 refills | Status: AC

## 2017-02-19 NOTE — Progress Notes (Signed)
No allergies to eggs or soy No diet meds No home oxygen No past problems with anesthesia  Registered emmi  Please utilize CRNA to start IV; pt has needle phobia and is a difficult IV start

## 2017-02-26 ENCOUNTER — Encounter: Payer: Self-pay | Admitting: Gastroenterology

## 2017-02-26 ENCOUNTER — Ambulatory Visit (AMBULATORY_SURGERY_CENTER): Payer: BLUE CROSS/BLUE SHIELD | Admitting: Gastroenterology

## 2017-02-26 VITALS — BP 135/86 | HR 67 | Temp 98.6°F | Resp 20 | Ht 74.0 in | Wt 250.0 lb

## 2017-02-26 DIAGNOSIS — D124 Benign neoplasm of descending colon: Secondary | ICD-10-CM | POA: Diagnosis not present

## 2017-02-26 DIAGNOSIS — D120 Benign neoplasm of cecum: Secondary | ICD-10-CM | POA: Diagnosis not present

## 2017-02-26 DIAGNOSIS — Z1211 Encounter for screening for malignant neoplasm of colon: Secondary | ICD-10-CM | POA: Diagnosis present

## 2017-02-26 DIAGNOSIS — Z1212 Encounter for screening for malignant neoplasm of rectum: Secondary | ICD-10-CM | POA: Diagnosis not present

## 2017-02-26 MED ORDER — SODIUM CHLORIDE 0.9 % IV SOLN: 500.0000 mL | INTRAVENOUS | Status: DC

## 2017-02-26 NOTE — Progress Notes (Signed)
Called to room to assist during endoscopic procedure.  Patient ID and intended procedure confirmed with present staff. Received instructions for my participation in the procedure from the performing physician.  

## 2017-02-26 NOTE — Op Note (Signed)
[place] Patient Name: Patrick Byrd Procedure Date: 02/26/2017 10:52 AM MRN: [id number] Endoscopist: Mauri Pole , MD Age: 51 Referring MD:  Date of Birth: January 25, 1966 Gender: Male Account #: [id number] Procedure:                Colonoscopy Indications:              Screening for colorectal malignant neoplasm, This                            is the patient's first colonoscopy Medicines:                Monitored Anesthesia Care Procedure:                Pre-Anesthesia Assessment:                           - Prior to the procedure, a History and Physical                            was performed, and patient medications and                            allergies were reviewed. The patient's tolerance of                            previous anesthesia was also reviewed. The risks                            and benefits of the procedure and the sedation                            options and risks were discussed with the patient.                            All questions were answered, and informed consent                            was obtained. Prior Anticoagulants: The patient has                            taken no previous anticoagulant or antiplatelet                            agents. ASA Grade Assessment: II - A patient with                            mild systemic disease. After reviewing the risks                            and benefits, the patient was deemed in                            satisfactory condition to undergo the procedure.  After obtaining informed consent, the colonoscope                            was passed under direct vision. Throughout the                            procedure, the patient's blood pressure, pulse, and                            oxygen saturations were monitored continuously. The                            Model CF-HQ190L [id number]) scope was introduced                            through the anus  and advanced to the the terminal                            ileum, with identification of the appendiceal                            orifice and IC valve. The colonoscopy was performed                            without difficulty. The patient tolerated the                            procedure well. The quality of the bowel                            preparation was excellent. The terminal ileum,                            ileocecal valve, appendiceal orifice, and rectum                            were photographed. Scope In: 11:02:59 AM Scope Out: 11:21:46 AM Scope Withdrawal Time: 0 hours 14 minutes 47 seconds  Total Procedure Duration: 0 hours 18 minutes 47 seconds  Findings:                 The perianal and digital rectal examinations were                            normal.                           A 5 mm polyp was found in the cecum. The polyp was                            sessile. The polyp was removed with a cold snare.                            Resection and retrieval were complete.  Two sessile polyps were found in the descending                            colon. The polyps were 1 to 3 mm in size. These                            polyps were removed with a cold biopsy forceps.                            Resection and retrieval were complete.                           Non-bleeding internal hemorrhoids were found during                            retroflexion. The hemorrhoids were small. Complications:            No immediate complications. Estimated Blood Loss:     Estimated blood loss was minimal. Impression:               - One 5 mm polyp in the cecum, removed with a cold                            snare. Resected and retrieved.                           - Two 1 to 3 mm polyps in the descending colon,                            removed with a cold biopsy forceps. Resected and                            retrieved.                           -  Non-bleeding internal hemorrhoids. Recommendation:           - Patient has a contact number available for                            emergencies. The signs and symptoms of potential                            delayed complications were discussed with the                            patient. Return to normal activities tomorrow.                            Written discharge instructions were provided to the                            patient.                           -  Resume previous diet.                           - Continue present medications.                           - Await pathology results.                           - Repeat colonoscopy in 5-10 years for surveillance                            based on pathology results. Mauri Pole, MD 02/26/2017 11:25:50 AM This report has been signed electronically.

## 2017-02-26 NOTE — Progress Notes (Signed)
No problems noted in the recovery room. maw 

## 2017-02-26 NOTE — Progress Notes (Signed)
  [facility] Post-op Note  Patient: Patrick Byrd.  Procedure(s) Performed: colonoscopy  Patient Location: [facility] - Recovery Area  Anesthesia Type: Deep Sedation/Propofol  Level of Consciousness: awake, oriented and patient cooperative  Airway and Oxygen Therapy: Patient Spontanous Breathing  Post-op Pain: none  Post-op Assessment:  Post-op Vital signs reviewed, Patient's Cardiovascular Status Stable, Respiratory Function Stable, Patent Airway, No signs of Nausea or vomiting and Pain level controlled  Post-op Vital Signs: Reviewed and stable  Complications: No apparent anesthesia complications  Caralina Nop E Corwin Kuiken 11:28 AM

## 2017-02-26 NOTE — Progress Notes (Signed)
Pt's states no medical or surgical changes since previsit or office visit. 

## 2017-02-26 NOTE — Patient Instructions (Signed)
YOU HAD AN ENDOSCOPIC PROCEDURE TODAY AT THE [facility]:   Refer to the procedure report that was given to you for any specific questions about what was found during the examination.  If the procedure report does not answer your questions, please call your gastroenterologist to clarify.  If you requested that your care partner not be given the details of your procedure findings, then the procedure report has been included in a sealed envelope for you to review at your convenience later.  YOU SHOULD EXPECT: Some feelings of bloating in the abdomen. Passage of more gas than usual.  Walking can help get rid of the air that was put into your GI tract during the procedure and reduce the bloating. If you had a lower endoscopy (such as a colonoscopy or flexible sigmoidoscopy) you may notice spotting of blood in your stool or on the toilet paper. If you underwent a bowel prep for your procedure, you may not have a normal bowel movement for a few days.  Please Note:  You might notice some irritation and congestion in your nose or some drainage.  This is from the oxygen used during your procedure.  There is no need for concern and it should clear up in a day or so.  SYMPTOMS TO REPORT IMMEDIATELY:   Following lower endoscopy (colonoscopy or flexible sigmoidoscopy):  Excessive amounts of blood in the stool  Significant tenderness or worsening of abdominal pains  Swelling of the abdomen that is new, acute  Fever of 100F or higher   For urgent or emergent issues, a gastroenterologist can be reached at any hour by calling [phone].   DIET:  We do recommend a small meal at first, but then you may proceed to your regular diet.  Drink plenty of fluids but you should avoid alcoholic beverages for 24 hours.  ACTIVITY:  You should plan to take it easy for the rest of today and you should NOT DRIVE or use heavy machinery until tomorrow (because of the sedation medicines used during the test).     FOLLOW UP: Our staff will call the number listed on your records the next business day following your procedure to check on you and address any questions or concerns that you may have regarding the information given to you following your procedure. If we do not reach you, we will leave a message.  However, if you are feeling well and you are not experiencing any problems, there is no need to return our call.  We will assume that you have returned to your regular daily activities without incident.  If any biopsies were taken you will be contacted by phone or by letter within the next 1-3 weeks.  Please call us at [phone] if you have not heard about the biopsies in 3 weeks.    SIGNATURES/CONFIDENTIALITY: You and/or your care partner have signed paperwork which will be entered into your electronic medical record.  These signatures attest to the fact that that the information above on your After Visit Summary has been reviewed and is understood.  Full responsibility of the confidentiality of this discharge information lies with you and/or your care-partner.    Handouts were given to your care partner on polyps and hemorrhoids. You may resume your current medications today. Await biopsy results. Please call if any questions or concerns.   

## 2017-02-27 ENCOUNTER — Telehealth: Payer: Self-pay | Admitting: *Deleted

## 2017-02-27 NOTE — Telephone Encounter (Signed)
  Follow up Call-  Call back number 02/26/2017  Post procedure Call Back phone  # [phone]  Permission to leave phone message Yes  Some recent data might be hidden     Patient questions:  Do you have a fever, pain , or abdominal swelling? No. Pain Score  0 *  Have you tolerated food without any problems? Yes.    Have you been able to return to your normal activities? Yes.    Do you have any questions about your discharge instructions: Diet   No. Medications  No. Follow up visit  No.  Do you have questions or concerns about your Care? No.  Actions: * If pain score is 4 or above: No action needed, pain <4.

## 2017-02-28 ENCOUNTER — Telehealth: Payer: Self-pay | Admitting: Family Medicine

## 2017-02-28 NOTE — Telephone Encounter (Signed)
I don't see anything in my notes. Find I will can a problem he is having

## 2017-02-28 NOTE — Telephone Encounter (Signed)
Pt called and stated at his last office visit he was informed he may have a possible hernia. He states he is not having some issues with that. He would like to be referred to get that taken care of. Pt can be reached at [phone].

## 2017-03-01 NOTE — Telephone Encounter (Signed)
Spoke to patient and he states that he was moving some furniture and he bumped it and now its tender. Its not growing.

## 2017-03-04 NOTE — Telephone Encounter (Signed)
Refer him

## 2017-03-05 NOTE — Telephone Encounter (Signed)
Left message for pt to call me back 

## 2017-03-06 ENCOUNTER — Encounter: Payer: Self-pay | Admitting: Gastroenterology

## 2017-03-06 NOTE — Telephone Encounter (Signed)
Middle rib cage hernia

## 2017-03-06 NOTE — Telephone Encounter (Signed)
Pt called and left a message on my voice mail. He would like a call back at [phone].

## 2017-03-06 NOTE — Telephone Encounter (Signed)
Faxed referral

## 2017-03-06 NOTE — Telephone Encounter (Signed)
Left message again to please call me back

## 2017-12-31 ENCOUNTER — Other Ambulatory Visit: Payer: Self-pay | Admitting: Cardiovascular Disease

## 2017-12-31 DIAGNOSIS — E785 Hyperlipidemia, unspecified: Secondary | ICD-10-CM

## 2017-12-31 NOTE — Telephone Encounter (Signed)
Rx has been sent to the pharmacy electronically. ° °

## 2018-06-26 ENCOUNTER — Other Ambulatory Visit: Payer: Self-pay | Admitting: Cardiovascular Disease

## 2018-06-26 DIAGNOSIS — E785 Hyperlipidemia, unspecified: Secondary | ICD-10-CM

## 2018-07-10 ENCOUNTER — Emergency Department (HOSPITAL_COMMUNITY)
Admission: EM | Admit: 2018-07-10 | Discharge: 2018-07-11 | Disposition: A | Discharge status: Home / Self Care | Payer: BLUE CROSS/BLUE SHIELD | Attending: Emergency Medicine | Admitting: Emergency Medicine

## 2018-07-10 ENCOUNTER — Encounter (HOSPITAL_COMMUNITY): Payer: Self-pay | Admitting: Emergency Medicine

## 2018-07-10 ENCOUNTER — Emergency Department (HOSPITAL_COMMUNITY): Payer: BLUE CROSS/BLUE SHIELD

## 2018-07-10 ENCOUNTER — Other Ambulatory Visit: Payer: Self-pay

## 2018-07-10 DIAGNOSIS — R519 Headache, unspecified: Secondary | ICD-10-CM

## 2018-07-10 DIAGNOSIS — I10 Essential (primary) hypertension: Secondary | ICD-10-CM | POA: Diagnosis not present

## 2018-07-10 DIAGNOSIS — R51 Headache: Secondary | ICD-10-CM | POA: Diagnosis not present

## 2018-07-10 DIAGNOSIS — Z79899 Other long term (current) drug therapy: Secondary | ICD-10-CM | POA: Insufficient documentation

## 2018-07-10 DIAGNOSIS — Z7982 Long term (current) use of aspirin: Secondary | ICD-10-CM | POA: Diagnosis not present

## 2018-07-10 LAB — CBC WITH DIFFERENTIAL/PLATELET
Abs Immature Granulocytes: 0 10*3/uL (ref 0.0–0.1)
Basophils Absolute: 0.1 10*3/uL (ref 0.0–0.1)
Basophils Relative: 1 %
Eosinophils Absolute: 0.3 10*3/uL (ref 0.0–0.7)
Eosinophils Relative: 4 %
HCT: 45.3 % (ref 39.0–52.0)
Hemoglobin: 15.2 g/dL (ref 13.0–17.0)
Immature Granulocytes: 0 %
Lymphocytes Relative: 41 %
Lymphs Abs: 2.9 10*3/uL (ref 0.7–4.0)
MCH: 31.3 pg (ref 26.0–34.0)
MCHC: 33.6 g/dL (ref 30.0–36.0)
MCV: 93.2 fL (ref 78.0–100.0)
Monocytes Absolute: 0.7 10*3/uL (ref 0.1–1.0)
Monocytes Relative: 10 %
Neutro Abs: 3.1 10*3/uL (ref 1.7–7.7)
Neutrophils Relative %: 44 %
Platelets: 233 10*3/uL (ref 150–400)
RBC: 4.86 MIL/uL (ref 4.22–5.81)
RDW: 12.2 % (ref 11.5–15.5)
WBC: 7 10*3/uL (ref 4.0–10.5)

## 2018-07-10 LAB — BASIC METABOLIC PANEL
Anion gap: 10 (ref 5–15)
BUN: 16 mg/dL (ref 6–20)
CO2: 26 mmol/L (ref 22–32)
Calcium: 9.8 mg/dL (ref 8.9–10.3)
Chloride: 101 mmol/L (ref 98–111)
Creatinine, Ser: 1.13 mg/dL (ref 0.61–1.24)
GFR calc Af Amer: >60 mL/min (ref >60)
GFR calc non Af Amer: >60 mL/min (ref >60)
Glucose, Bld: 97 mg/dL (ref 70–99)
Potassium: 4.6 mmol/L (ref 3.5–5.1)
Sodium: 137 mmol/L (ref 135–145)

## 2018-07-10 MED ORDER — DIPHENHYDRAMINE HCL 50 MG/ML IJ SOLN
25.0000 mg | Freq: Once | INTRAMUSCULAR | Status: AC
  Administered 2018-07-10: 25 mg via INTRAVENOUS
  Filled 2018-07-10: qty 1

## 2018-07-10 MED ORDER — DEXAMETHASONE SODIUM PHOSPHATE 10 MG/ML IJ SOLN
10.0000 mg | Freq: Once | INTRAMUSCULAR | Status: AC
  Administered 2018-07-10: 10 mg via INTRAVENOUS
  Filled 2018-07-10: qty 1

## 2018-07-10 MED ORDER — IOPAMIDOL (ISOVUE-370) INJECTION 76%
INTRAVENOUS | Status: AC
  Filled 2018-07-10: qty 50

## 2018-07-10 MED ORDER — IOPAMIDOL (ISOVUE-370) INJECTION 76%
50.0000 mL | Freq: Once | INTRAVENOUS | Status: AC | PRN
  Administered 2018-07-10: 50 mL via INTRAVENOUS

## 2018-07-10 MED ORDER — METOCLOPRAMIDE HCL 5 MG/ML IJ SOLN
10.0000 mg | Freq: Once | INTRAMUSCULAR | Status: AC
  Administered 2018-07-10: 10 mg via INTRAVENOUS
  Filled 2018-07-10: qty 2

## 2018-07-10 NOTE — ED Provider Notes (Signed)
[facility]ERGENCY DEPARTMENT Provider Note   CSN: [id number] Arrival date & time: 07/10/18  2029     History   Chief Complaint Chief Complaint  Patient presents with  . Migraine  . Neck Pain    HPI Patrick Lembo. is a 52 y.o. male.  Patient presents to the emergency department with a chief complaint of persistent headache x3 weeks.  He states that he has had a headache almost every single day.  States that it is worsened when he lies down, and improves when he stands up.  He denies any fevers or chills.  He reports some associated pain in the right side of his neck.  He states that he can feel a throbbing in his head.  He reports having some mild blurred vision over the past several weeks, but states that it is not very noticeable.  He denies any vision loss, or diplopia.  He states that he has had some numbness in his left middle toe and right great toe, but otherwise denies any other numbness, weakness, or tingling.  Denies feeling unsteady or off balance.  He is tried taking OTC Excedrin, Tylenol, and ibuprofen with no relief of his symptoms.  He states that he was in [place] yesterday, and had to end his trip early due to the persistent nature of the headache.  He states that he has never had a headache like this before.  Of note, his spouse is an Therapist, sports for [employer].  The history is provided by the patient. No language interpreter was used.    Past Medical History:  Diagnosis Date  . Allergic rhinitis   . ED (erectile dysfunction)   . Family history of premature CAD   . History of cardiovascular stress test 12/16/09   Myoview, nonischemic; Dr. Gwenlyn Found  . Hyperlipidemia   . Hypertension    pt denies  . Mixed dyslipidemia   . Obesity   . Rosacea    sees dermatology  . Vitamin D deficiency     Patient Active Problem List   Diagnosis Date Noted  . Obesity (BMI 30.0-34.9) 01/01/2017  . Rosacea 01/01/2017  . Actinic keratosis 01/01/2017  .  History of back surgery 01/01/2017  . Essential hypertension 09/27/2015  . Hyperlipidemia 08/03/2014  . Family history of early CAD 08/03/2014    Past Surgical History:  Procedure Laterality Date  . LUMBAR DISC SURGERY     [facility] x2        Home Medications    Prior to Admission medications   Medication Sig Start Date End Date Taking? Authorizing Provider  Ascorbic Acid (VITAMIN C) 1000 MG tablet Take 100 mg by mouth daily.    [provider]  aspirin 81 MG tablet Take 81 mg by mouth daily.    [provider]  cetirizine (ZYRTEC) 10 MG tablet Take 10 mg by mouth daily.    [provider]  cholecalciferol (VITAMIN D) 1000 units tablet Take 1,000 Units by mouth daily.    [provider]  CINNAMON PO Take 1,000 mg by mouth.     [provider]  CRESTOR 5 MG tablet Take 1 tablet (5 mg total) by mouth daily. SCHEDULE OV FOR FURTHER REFILLS 06/26/18   Lorretta Harp, MD  doxycycline (ADOXA) 50 MG tablet Take 100 mg by mouth. Every other day    [provider]  Multiple Vitamins-Minerals (MULTIVITAMIN WITH MINERALS) tablet Take 1 tablet by mouth daily.  [provider]  Omega-3 Fatty Acids (FISH OIL) 1000 MG CAPS Take by mouth.    [provider]  sildenafil (VIAGRA) 100 MG tablet Take 1 tablet (100 mg total) by mouth daily as needed for erectile dysfunction. 03/26/13   Tysinger, Camelia Eng, PA-C    Family History Family History  Problem Relation Age of Onset  . Hypertension Father   . Heart disease Father 32       CABG  . Arthritis Sister   . Cancer Paternal Grandmother        brain  . Arthritis Paternal Grandmother   . Stroke Paternal Grandfather   . Cancer Mother        skin  . Diabetes Paternal Aunt   . Heart disease Paternal Uncle 52       MI  . Heart disease Paternal Uncle 64       MI  . Colon cancer Neg Hx     Social History Social History   Tobacco Use  . Smoking status: Never Smoker  .  Smokeless tobacco: Never Used  Substance Use Topics  . Alcohol use: Yes    Alcohol/week: 0.0 standard drinks    Comment: 3 times weekly  . Drug use: No     Allergies   Statins   Review of Systems Review of Systems  All other systems reviewed and are negative.    Physical Exam Updated Vital Signs BP (!) 138/100 (BP Location: Right Arm)   Pulse 84   Temp 98 F (36.7 C)   Resp 18   Ht 6\' 2"  (1.88 m)   Wt 108.9 kg   SpO2 99%   BMI 30.81 kg/m   Physical Exam  Constitutional: He is oriented to person, place, and time. He appears well-developed and well-nourished.  HENT:  Head: Normocephalic and atraumatic.  Right Ear: External ear normal.  Left Ear: External ear normal.  Eyes: Pupils are equal, round, and reactive to light. Conjunctivae and EOM are normal. Right eye exhibits no discharge. Left eye exhibits no discharge. No scleral icterus.  Neck: Normal range of motion. Neck supple. No JVD present.  No pain with neck flexion, no meningismus  Cardiovascular: Normal rate, regular rhythm and normal heart sounds. Exam reveals no gallop and no friction rub.  No murmur heard. Pulmonary/Chest: Effort normal and breath sounds normal. No respiratory distress. He has no wheezes. He has no rales. He exhibits no tenderness.  Abdominal: Soft. He exhibits no distension and no mass. There is no tenderness. There is no rebound and no guarding.  Musculoskeletal: Normal range of motion. He exhibits no edema or tenderness.  Normal gait.  Neurological: He is alert and oriented to person, place, and time. He has normal reflexes.  CN 3-12 intact, normal finger to nose, no pronator drift, sensation and strength intact bilaterally.  Skin: Skin is warm and dry.  Psychiatric: He has a normal mood and affect. His behavior is normal. Judgment and thought content normal.  Nursing note and vitals reviewed.    ED Treatments / Results  Labs (all labs ordered are listed, but only abnormal results  are displayed) Labs Reviewed  CBC WITH DIFFERENTIAL/PLATELET  BASIC METABOLIC PANEL    EKG None  Radiology Ct Angio Head W Or Wo Contrast  Result Date: 07/11/2018 CLINICAL DATA:  52 y/o M; intermittent headache for 3 weeks. Headache is right-sided traveling to the neck. EXAM: CT ANGIOGRAPHY HEAD AND NECK TECHNIQUE: Multidetector CT imaging of the head and neck was  performed using the standard protocol during bolus administration of intravenous contrast. Multiplanar CT image reconstructions and MIPs were obtained to evaluate the vascular anatomy. Carotid stenosis measurements (when applicable) are obtained utilizing NASCET criteria, using the distal internal carotid diameter as the denominator. CONTRAST:  50 cc Isovue 370 COMPARISON:  None. FINDINGS: CT HEAD FINDINGS Brain: No evidence of acute infarction, hemorrhage, hydrocephalus, extra-axial collection or mass lesion/mass effect. Vascular: As below. Skull: Normal. Negative for fracture or focal lesion. Sinuses: Imaged portions are clear. Orbits: No acute finding. Review of the MIP images confirms the above findings CTA NECK FINDINGS Aortic arch: Bovine variant branching. Imaged portion shows no evidence of aneurysm or dissection. No significant stenosis of the major arch vessel origins. Right carotid system: No evidence of dissection, stenosis (50% or greater) or occlusion. Minimal non stenotic calcific atherosclerosis of the right carotid bifurcation. Left carotid system: No evidence of dissection, stenosis (50% or greater) or occlusion. Vertebral arteries: Codominant. No evidence of dissection, stenosis (50% or greater) or occlusion. Skeleton: Mild cervical spondylosis with discogenic degenerative changes greatest at the C5-C7 levels. Other neck: Negative. Upper chest: Negative. Review of the MIP images confirms the above findings CTA HEAD FINDINGS Anterior circulation: No significant stenosis, proximal occlusion, aneurysm, or vascular malformation.  Minimal non stenotic calcific atherosclerosis of carotid siphons. Posterior circulation: No significant stenosis, proximal occlusion, aneurysm, or vascular malformation. Venous sinuses: As permitted by contrast timing, patent. Anatomic variants: None significant. Delayed phase: No abnormal intracranial enhancement. Review of the MIP images confirms the above findings IMPRESSION: 1. No acute intracranial abnormality identified. Unremarkable CT of the head. 2. Patent carotid and vertebral arteries of the neck. No dissection, aneurysm, or hemodynamically significant stenosis utilizing NASCET criteria. 3. Patent anterior and posterior intracranial circulation. No large vessel occlusion, aneurysm, or significant stenosis. No findings of vasculitis. 4. Minimal non stenotic calcific atherosclerosis of carotid systems. 5. Mild spondylosis of the cervical spine greatest at C5-C7 levels. Electronically Signed   By: Kristine Garbe M.D.   On: 07/11/2018 00:33   Ct Angio Neck W And/or Wo Contrast  Result Date: 07/11/2018 CLINICAL DATA:  52 y/o M; intermittent headache for 3 weeks. Headache is right-sided traveling to the neck. EXAM: CT ANGIOGRAPHY HEAD AND NECK TECHNIQUE: Multidetector CT imaging of the head and neck was performed using the standard protocol during bolus administration of intravenous contrast. Multiplanar CT image reconstructions and MIPs were obtained to evaluate the vascular anatomy. Carotid stenosis measurements (when applicable) are obtained utilizing NASCET criteria, using the distal internal carotid diameter as the denominator. CONTRAST:  50 cc Isovue 370 COMPARISON:  None. FINDINGS: CT HEAD FINDINGS Brain: No evidence of acute infarction, hemorrhage, hydrocephalus, extra-axial collection or mass lesion/mass effect. Vascular: As below. Skull: Normal. Negative for fracture or focal lesion. Sinuses: Imaged portions are clear. Orbits: No acute finding. Review of the MIP images confirms the above  findings CTA NECK FINDINGS Aortic arch: Bovine variant branching. Imaged portion shows no evidence of aneurysm or dissection. No significant stenosis of the major arch vessel origins. Right carotid system: No evidence of dissection, stenosis (50% or greater) or occlusion. Minimal non stenotic calcific atherosclerosis of the right carotid bifurcation. Left carotid system: No evidence of dissection, stenosis (50% or greater) or occlusion. Vertebral arteries: Codominant. No evidence of dissection, stenosis (50% or greater) or occlusion. Skeleton: Mild cervical spondylosis with discogenic degenerative changes greatest at the C5-C7 levels. Other neck: Negative. Upper chest: Negative. Review of the MIP images confirms the above findings CTA HEAD FINDINGS  Anterior circulation: No significant stenosis, proximal occlusion, aneurysm, or vascular malformation. Minimal non stenotic calcific atherosclerosis of carotid siphons. Posterior circulation: No significant stenosis, proximal occlusion, aneurysm, or vascular malformation. Venous sinuses: As permitted by contrast timing, patent. Anatomic variants: None significant. Delayed phase: No abnormal intracranial enhancement. Review of the MIP images confirms the above findings IMPRESSION: 1. No acute intracranial abnormality identified. Unremarkable CT of the head. 2. Patent carotid and vertebral arteries of the neck. No dissection, aneurysm, or hemodynamically significant stenosis utilizing NASCET criteria. 3. Patent anterior and posterior intracranial circulation. No large vessel occlusion, aneurysm, or significant stenosis. No findings of vasculitis. 4. Minimal non stenotic calcific atherosclerosis of carotid systems. 5. Mild spondylosis of the cervical spine greatest at C5-C7 levels. Electronically Signed   By: Kristine Garbe M.D.   On: 07/11/2018 00:33    Procedures Procedures (including critical care time)  Medications Ordered in ED Medications    diphenhydrAMINE (BENADRYL) injection 25 mg (has no administration in time range)  metoCLOPramide (REGLAN) injection 10 mg (has no administration in time range)  dexamethasone (DECADRON) injection 10 mg (has no administration in time range)     Initial Impression / Assessment and Plan / ED Course  I have reviewed the triage vital signs and the nursing notes.  Pertinent labs & imaging results that were available during my care of the patient were reviewed by me and considered in my medical decision making (see chart for details).    Patient with headache x3 weeks.  Reports worsening headache when lying down, improved when standing up.  Will check CT head.  Will treat with headache cocktail.  Pt HA treated and improved while in ED.  No evidence to suggest SAH, ICH, Meningitis, or temporal arteritis. Pt is afebrile with no focal neuro deficits, nuchal rigidity, or change in vision. Pt is to follow up with PCP to discuss prophylactic medication. Pt verbalizes understanding and is agreeable with plan to dc.  Recommend also neurology follow-up.   Final Clinical Impressions(s) / ED Diagnoses   Final diagnoses:  Nonintractable headache, unspecified chronicity pattern, unspecified headache type    ED Discharge Orders         Ordered    metoCLOPramide (REGLAN) 10 MG tablet  Every 8 hours PRN     07/11/18 0047    ibuprofen (ADVIL,MOTRIN) 800 MG tablet  Every 8 hours PRN     07/11/18 0047           Montine Circle, PA-C 07/11/18 0050    Orpah Greek, MD 07/11/18 [phone]

## 2018-07-10 NOTE — ED Triage Notes (Addendum)
Pt reports intermittent headache that has been going on for the last 3 weeks. Pt has taken Excedrin, ibuprofen and tylenol with minimal relief. Pt reports headache is on the right side that travels down to his neck and gets worse when lying down. No other sx, no injuries.

## 2018-07-11 ENCOUNTER — Ambulatory Visit: Payer: BLUE CROSS/BLUE SHIELD | Admitting: Family Medicine

## 2018-07-11 ENCOUNTER — Encounter: Payer: Self-pay | Admitting: Family Medicine

## 2018-07-11 VITALS — BP 122/84 | HR 94 | Temp 97.8°F | Ht 74.0 in | Wt 239.0 lb

## 2018-07-11 DIAGNOSIS — R51 Headache: Secondary | ICD-10-CM | POA: Diagnosis not present

## 2018-07-11 DIAGNOSIS — R519 Headache, unspecified: Secondary | ICD-10-CM

## 2018-07-11 MED ORDER — METOCLOPRAMIDE HCL 10 MG PO TABS: 10.0000 mg | ORAL_TABLET | Freq: Three times a day (TID) | ORAL | 0 refills | Status: DC | PRN

## 2018-07-11 MED ORDER — IBUPROFEN 800 MG PO TABS: 800.0000 mg | ORAL_TABLET | Freq: Three times a day (TID) | ORAL | 0 refills | Status: DC | PRN

## 2018-07-11 NOTE — Progress Notes (Signed)
   Subjective:    Patient ID: Patrick Byrd., male    DOB: [birth year], 52 y.o.   MRN: [id number]  HPI He is here for follow-up visit after being seen in the emergency room last night.  He has a 3-week history of intermittent throbbing right-sided headache with some blurred vision that is worse when he lies down.  No fever, chills, cough, congestion, sore throat, rhinorrhea, PND.  No photophobia or phonophobia or nausea.  He has no previous history of headaches. The emergency room record was reviewed including blood work and a CT scan which was negative.  He was given Reglan and ibuprofen 800 3 times daily. He travels a lot internationally.  Review of Systems     Objective:   Physical Exam Alert and in no distress.  EOMI.  Other cranial nerves grossly intact.  DTRs normal.  Normal finger-to-nose.  Cerebellar testing normal.  Tympanic membranes and canals are normal. Pharyngeal area is normal. Neck is supple without adenopathy or thyromegaly. Cardiac exam shows a regular sinus rhythm without murmurs or gallops. Lungs are clear to auscultation.       Assessment & Plan:  New onset headache He is to continue on ibuprofen.  He is getting ready to travel to [place].  I will set up the MRI for the day that he comes back on October 14.  Discussed possible neurology referral.  Explained that since this is new onset of headache, an MRI is totally appropriate.  He is to continue on 800 3 times daily of Ibuprin We will also discuss further testing including hepatitis C and hepatitis A when he returns.

## 2018-07-11 NOTE — ED Notes (Signed)
Patient verbalizes understanding of discharge instructions. Opportunity for questioning and answers were provided. Armband removed by staff, pt discharged from ED ambulatory.   

## 2018-07-17 ENCOUNTER — Encounter: Payer: Self-pay | Admitting: Neurology

## 2018-07-21 ENCOUNTER — Ambulatory Visit
Admission: RE | Admit: 2018-07-21 | Discharge: 2018-07-21 | Disposition: A | Discharge status: Home / Self Care | Payer: BLUE CROSS/BLUE SHIELD | Source: Ambulatory Visit | Attending: Family Medicine | Admitting: Family Medicine

## 2018-07-21 DIAGNOSIS — R51 Headache: Principal | ICD-10-CM

## 2018-07-21 DIAGNOSIS — R519 Headache, unspecified: Secondary | ICD-10-CM

## 2018-07-21 MED ORDER — GADOBENATE DIMEGLUMINE 529 MG/ML IV SOLN
20.0000 mL | Freq: Once | INTRAVENOUS | Status: AC | PRN
  Administered 2018-07-21: 20 mL via INTRAVENOUS

## 2018-09-19 ENCOUNTER — Other Ambulatory Visit: Payer: Self-pay | Admitting: Cardiovascular Disease

## 2018-09-19 DIAGNOSIS — E785 Hyperlipidemia, unspecified: Secondary | ICD-10-CM

## 2018-09-19 NOTE — Telephone Encounter (Signed)
Rx request sent to pharmacy.  

## 2018-09-22 ENCOUNTER — Ambulatory Visit: Payer: BLUE CROSS/BLUE SHIELD | Admitting: Neurology

## 2018-10-17 ENCOUNTER — Other Ambulatory Visit: Payer: Self-pay | Admitting: Cardiovascular Disease

## 2018-10-17 DIAGNOSIS — E785 Hyperlipidemia, unspecified: Secondary | ICD-10-CM

## 2018-11-07 ENCOUNTER — Ambulatory Visit: Payer: BLUE CROSS/BLUE SHIELD | Admitting: Cardiovascular Disease

## 2018-11-07 ENCOUNTER — Encounter: Payer: Self-pay | Admitting: Cardiovascular Disease

## 2018-11-07 VITALS — BP 122/88 | HR 65 | Ht 74.0 in | Wt 241.4 lb

## 2018-11-07 DIAGNOSIS — Z8249 Family history of ischemic heart disease and other diseases of the circulatory system: Secondary | ICD-10-CM | POA: Diagnosis not present

## 2018-11-07 DIAGNOSIS — E785 Hyperlipidemia, unspecified: Secondary | ICD-10-CM | POA: Diagnosis not present

## 2018-11-07 DIAGNOSIS — I10 Essential (primary) hypertension: Secondary | ICD-10-CM

## 2018-11-07 LAB — LIPID PANEL
Chol/HDL Ratio: 3 ratio (ref 0.0–5.0)
Cholesterol, Total: 179 mg/dL (ref 100–199)
HDL: 59 mg/dL (ref >39)
LDL Calculated: 91 mg/dL (ref 0–99)
Triglycerides: 143 mg/dL (ref 0–149)
VLDL Cholesterol Cal: 29 mg/dL (ref 5–40)

## 2018-11-07 LAB — HEPATIC FUNCTION PANEL
ALT: 47 IU/L — ABNORMAL HIGH (ref 0–44)
AST: 33 IU/L (ref 0–40)
Albumin: 4.6 g/dL (ref 3.8–4.9)
Alkaline Phosphatase: 86 IU/L (ref 39–117)
Bilirubin Total: 0.4 mg/dL (ref 0.0–1.2)
Bilirubin, Direct: 0.14 mg/dL (ref 0.00–0.40)
Total Protein: 6.9 g/dL (ref 6.0–8.5)

## 2018-11-07 MED ORDER — ATORVASTATIN CALCIUM 10 MG PO TABS: 10.0000 mg | ORAL_TABLET | Freq: Every day | ORAL | 3 refills | Status: DC

## 2018-11-07 NOTE — Assessment & Plan Note (Signed)
History of hyperlipidemia on rosuvastatin 5 mg a day per profile performed 12/26/2016 revealing total cholesterol 166, LDL of 74 and HDL 49.  He apparently was on other statin drugs prior to that and had myalgias.  We will check a lipid profile today.  Options would be to retry atorvastatin 10 mg a day.

## 2018-11-07 NOTE — Progress Notes (Signed)
11/07/2018 Patrick Byrd.   02-03-1966  [id number]  Primary Physician Denita Lung, MD Primary Cardiologist: Lorretta Harp MD FACP, [place], [place], [place]  HPI:  Patrick Byrd. is a 53 y.o.  moderately overweight married Caucasian male father of 3 children who I last saw in the office  12/26/2016. 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  He recently returned from [place] last night and has traveled to [place].  He is on a plane on a weekly basis.  He has a history of hyperlipidemia and a positive family history of heart disease with a father who had bypass surgery at age 28. He is completely asymptomatic. His last Myoview performed 12/16/09 was nonischemic.  As I saw him about 2 years ago he is remained asymptomatic.  He has started working out every other day within the last 6 months.  Current Meds  Medication Sig  . Ascorbic Acid (VITAMIN C) 1000 MG tablet Take 100 mg by mouth daily.  Marland Kitchen aspirin 81 MG tablet Take 81 mg by mouth daily.  . cholecalciferol (VITAMIN D) 1000 units tablet Take 1,000 Units by mouth daily.  . CRESTOR 5 MG tablet TAKE 1 TABLET (5 MG TOTAL) BY MOUTH DAILY. PLEASE SCHEDULE APPOINTMENT FOR REFILLS.  Marland Kitchen doxycycline (ADOXA) 50 MG tablet Take 100 mg by mouth. Every other day  . Multiple Vitamins-Minerals (MULTIVITAMIN WITH MINERALS) tablet Take 1 tablet by mouth daily.    . Omega-3 Fatty Acids (FISH OIL) 1000 MG CAPS Take 1,000 mg by mouth daily.   . sildenafil (VIAGRA) 100 MG tablet Take 1 tablet (100 mg total) by mouth daily as needed for erectile dysfunction.  . [DISCONTINUED] ibuprofen (ADVIL,MOTRIN) 800 MG tablet Take 1 tablet (800 mg total) by mouth every 8 (eight) hours as needed.  . [DISCONTINUED] metoCLOPramide (REGLAN) 10 MG tablet Take 1 tablet (10 mg total) by mouth every 8 (eight) hours as needed for nausea.    Current Facility-Administered Medications for the 11/07/18 encounter (Office Visit) with Lorretta Harp, MD  Medication  . 0.9 %  sodium chloride infusion     Allergies  Allergen Reactions  . Statins     Social History   Socioeconomic History  . Marital status: Married    Spouse name: Not on file  . Number of children: Not on file  . Years of education: Not on file  . Highest education level: Not on file  Occupational History  . Not on file  Social Needs  . Financial resource strain: Not on file  . Food insecurity:    Worry: Not on file    Inability: Not on file  . Transportation needs:    Medical: Not on file    Non-medical: Not on file  Tobacco Use  . Smoking status: Never Smoker  . Smokeless tobacco: Never Used  Substance and Sexual Activity  . Alcohol use: Yes    Alcohol/week: 0.0 standard drinks    Comment: 3 times weekly  . Drug use: No  . Sexual activity: Not on file  Lifestyle  . Physical activity:    Days per week: Not on file    Minutes per session: Not on file  . Stress: Not on file  Relationships  . Social connections:    Talks on phone: Not on file    Gets together: Not on file  Attends religious service: Not on file    Active member of club or organization: Not on file    Attends meetings of clubs or organizations: Not on file    Relationship status: Not on file  . Intimate partner violence:    Fear of current or ex partner: Not on file    Emotionally abused: Not on file    Physically abused: Not on file    Forced sexual activity: Not on file  Other Topics Concern  . Not on file  Social History Narrative   ** Merged History Encounter **       Married, works in Herbalist, Public relations account executive.  3 children.  Exercise - walk, cycling some.       Review of Systems: General: negative for chills, fever, night sweats or weight changes.  Cardiovascular: negative for chest pain, dyspnea on exertion, edema, orthopnea,  palpitations, paroxysmal nocturnal dyspnea or shortness of breath Dermatological: negative for rash Respiratory: negative for cough or wheezing Urologic: negative for hematuria Abdominal: negative for nausea, vomiting, diarrhea, bright red blood per rectum, melena, or hematemesis Neurologic: negative for visual changes, syncope, or dizziness All other systems reviewed and are otherwise negative except as noted above.    Blood pressure 122/88, pulse 65, height 6\' 2"  (1.88 m), weight 241 lb 6.4 oz (109.5 kg).  General appearance: alert and no distress Neck: no adenopathy, no carotid bruit, no JVD, supple, symmetrical, trachea midline and thyroid not enlarged, symmetric, no tenderness/mass/nodules Lungs: clear to auscultation bilaterally Heart: regular rate and rhythm, S1, S2 normal, no murmur, click, rub or gallop Extremities: extremities normal, atraumatic, no cyanosis or edema Pulses: 2+ and symmetric Skin: Skin color, texture, turgor normal. No rashes or lesions Neurologic: Alert and oriented X 3, normal strength and tone. Normal symmetric reflexes. Normal coordination and gait  EKG sinus rhythm at 65 without ST or T wave changes.  I personally reviewed this EKG.  ASSESSMENT AND PLAN:   Hyperlipidemia History of hyperlipidemia on rosuvastatin 5 mg a day per profile performed 12/26/2016 revealing total cholesterol 166, LDL of 74 and HDL 49.  He apparently was on other statin drugs prior to that and had myalgias.  We will check a lipid profile today.  Options would be to retry atorvastatin 10 mg a day.  Essential hypertension History of essential hypertension her blood pressure measured today at 122/88.  He is not on antihypertensive medications.      Lorretta Harp MD FACP,FACC,FAHA, [facility] 11/07/2018 9:38 AM

## 2018-11-07 NOTE — Assessment & Plan Note (Signed)
History of essential hypertension her blood pressure measured today at 122/88.  He is not on antihypertensive medications.

## 2018-11-07 NOTE — Patient Instructions (Signed)
Medication Instructions:  AN ORDER HAS BEEN PLACED FOR ATORVASTATIN 10 MG, ONE TABLET BY MOUTH DAILY. PENDING YOUR LAB WORK, YOU WILL BE CONTACTED ON WHETHER OR NOT TO START TAKING THIS MEDICATION. If you need a refill on your cardiac medications before your next appointment, please call your pharmacy.   Lab work: Your physician recommends that you return for lab work today; lipid/liver If you have labs (blood work) drawn today and your tests are completely normal, you will receive your results only by: Marland Kitchen MyChart Message (if you have MyChart) OR . A paper copy in the mail If you have any lab test that is abnormal or we need to change your treatment, we will call you to review the results.  Testing/Procedures:  Coronary Calcium Scan A coronary calcium scan is an imaging test used to look for deposits of calcium and other fatty materials (plaques) in the inner lining of the blood vessels of the heart (coronary arteries). These deposits of calcium and plaques can partly clog and narrow the coronary arteries without producing any symptoms or warning signs. This puts a person at risk for a heart attack. This test can detect these deposits before symptoms develop. Tell a health care provider about:  Any allergies you have.  All medicines you are taking, including vitamins, herbs, eye drops, creams, and over-the-counter medicines.  Any problems you or family members have had with anesthetic medicines.  Any blood disorders you have.  Any surgeries you have had.  Any medical conditions you have.  Whether you are pregnant or may be pregnant. What are the risks? Generally, this is a safe procedure. However, problems may occur, including:  Harm to a pregnant woman and her unborn baby. This test involves the use of radiation. Radiation exposure can be dangerous to a pregnant woman and her unborn baby. If you are pregnant, you generally should not have this procedure done.  Slight increase in  the risk of cancer. This is because of the radiation involved in the test. What happens before the procedure? No preparation is needed for this procedure. What happens during the procedure?   You will undress and remove any jewelry around your neck or chest.  You will put on a hospital gown.  Sticky electrodes will be placed on your chest. The electrodes will be connected to an electrocardiogram (ECG) machine to record a tracing of the electrical activity of your heart.  A CT scanner will take pictures of your heart. During this time, you will be asked to lie still and hold your breath for 2-3 seconds while a picture of your heart is being taken. The procedure may vary among health care providers and hospitals. What happens after the procedure?  You can get dressed.  You can return to your normal activities.  It is up to you to get the results of your test. Ask your health care provider, or the department that is doing the test, when your results will be ready. Summary  A coronary calcium scan is an imaging test used to look for deposits of calcium and other fatty materials (plaques) in the inner lining of the blood vessels of the heart (coronary arteries).  Generally, this is a safe procedure. Tell your health care provider if you are pregnant or may be pregnant.  No preparation is needed for this procedure.  A CT scanner will take pictures of your heart.  You can return to your normal activities after the scan is done. This information is  not intended to replace advice given to you by your health care provider. Make sure you discuss any questions you have with your health care provider. Document Released: 03/22/2008 Document Revised: 08/13/2016 Document Reviewed: 08/13/2016 Elsevier Interactive Patient Education  2019 [employer].   Follow-Up: At [facility], you and your health needs are our priority.  As part of our continuing mission to provide you with exceptional  heart care, we have created designated Provider Care Teams.  These Care Teams include your primary Cardiologist (physician) and Advanced Practice Providers (APPs -  Physician Assistants and Nurse Practitioners) who all work together to provide you with the care you need, when you need it. . You will need a follow up appointment in 12 months.  Please call our office 2 months in advance to schedule this appointment.  You may see Dr. Gwenlyn Found or one of the following Advanced Practice Providers on your designated Care Team:   . Kerin Ransom, [place] . Almyra Deforest, PA-C . Fabian Sharp, PA-C . Jory Sims, DNP . Rosaria Ferries, PA-C . Roby Lofts, PA-C . Sande Rives, PA-C

## 2018-12-01 ENCOUNTER — Ambulatory Visit (INDEPENDENT_AMBULATORY_CARE_PROVIDER_SITE_OTHER)
Admission: RE | Admit: 2018-12-01 | Discharge: 2018-12-01 | Disposition: A | Discharge status: Home / Self Care | Payer: Self-pay | Source: Ambulatory Visit | Attending: Cardiovascular Disease | Admitting: Cardiovascular Disease

## 2018-12-01 DIAGNOSIS — E785 Hyperlipidemia, unspecified: Secondary | ICD-10-CM

## 2019-11-05 ENCOUNTER — Other Ambulatory Visit: Payer: Self-pay | Admitting: Cardiovascular Disease

## 2019-11-05 NOTE — Telephone Encounter (Signed)
Do you want this pt to continue taking this medication. Please advise.  Thanks

## 2019-11-10 ENCOUNTER — Other Ambulatory Visit: Payer: Self-pay

## 2019-11-10 NOTE — Telephone Encounter (Signed)
Is it ok to fill Lipitor. There is a note on the prescription to not start until after lab work. Lab results on 1/31 just says excellent FLP and LFTs.

## 2019-11-11 NOTE — Telephone Encounter (Signed)
°*  STAT* If patient is at the pharmacy, call can be transferred to refill team.   1. Which medications need to be refilled? (please list name of each medication and dose if known) Lipitor   2. Which pharmacy/location (including street and city if local pharmacy) is medication to be sent to? [facility]   3. Do they need a 30 day or 90 day supply? 30 but would like 90 patient has a up coming appt

## 2019-11-13 MED ORDER — ATORVASTATIN CALCIUM 10 MG PO TABS: 10.0000 mg | ORAL_TABLET | Freq: Every day | ORAL | 3 refills | Status: DC

## 2019-11-17 ENCOUNTER — Telehealth: Payer: Self-pay

## 2019-11-17 ENCOUNTER — Telehealth (INDEPENDENT_AMBULATORY_CARE_PROVIDER_SITE_OTHER): Payer: 59 | Admitting: Cardiology

## 2019-11-17 ENCOUNTER — Encounter: Payer: Self-pay | Admitting: Cardiology

## 2019-11-17 VITALS — Ht 74.0 in | Wt 235.0 lb

## 2019-11-17 DIAGNOSIS — Z8249 Family history of ischemic heart disease and other diseases of the circulatory system: Secondary | ICD-10-CM

## 2019-11-17 DIAGNOSIS — E785 Hyperlipidemia, unspecified: Secondary | ICD-10-CM

## 2019-11-17 DIAGNOSIS — I10 Essential (primary) hypertension: Secondary | ICD-10-CM

## 2019-11-17 DIAGNOSIS — E669 Obesity, unspecified: Secondary | ICD-10-CM

## 2019-11-17 NOTE — Progress Notes (Signed)
Virtual Visit via Telephone Note   This visit type was conducted due to national recommendations for restrictions regarding the COVID-19 Pandemic (e.g. social distancing) in an effort to limit this patient's exposure and mitigate transmission in our community.  Due to his co-morbid illnesses, this patient is at least at moderate risk for complications without adequate follow up.  This format is felt to be most appropriate for this patient at this time.  The patient did not have access to video technology/had technical difficulties with video requiring transitioning to audio format only (telephone).  All issues noted in this document were discussed and addressed.  No physical exam could be performed with this format.  Please refer to the patient's chart for his  consent to telehealth for [facility].   Date:  11/17/2019   ID:  Patrick Grout., DOB 1966-08-11, MRN [id number]  Patient Location: Home Provider Location: Office  PCP:  Denita Lung, MD  Cardiologist:  Dr Gwenlyn Found Electrophysiologist:  None   Evaluation Performed:  Follow-Up Visit  Chief Complaint:  none  History of Present Illness:    Patrick Nagarajan. is a 54 y.o. male with a FM Hx of CAD, his father had a CABG in his 66's.  The patient had a low risk nuclear stress in 2011.  Ca++ scoring done in 2020-44, 75%. He has no history of chest pain or unusual dyspnea.  He has HLD and tells me his lipids are followed by Dr Gwenlyn Found.  His last LDL one year ago was 90.    He was contacted today for routine follow up.  Since we saw him last he has been doing well, no unusual chest pain or DOE. He has been exercising routinely, riding a bike 8 miles QOD and doing light weights on other days. He has had no issues with his medications.   The patient does not have symptoms concerning for COVID-19 infection (fever, chills, cough, or new shortness of breath).    Past Medical History:  Diagnosis Date  . Allergic rhinitis   .  ED (erectile dysfunction)   . Family history of premature CAD   . History of cardiovascular stress test 12/16/09   Myoview, nonischemic; Dr. Gwenlyn Found  . Hyperlipidemia   . Hypertension    pt denies  . Mixed dyslipidemia   . Obesity   . Rosacea    sees dermatology  . Vitamin D deficiency    Past Surgical History:  Procedure Laterality Date  . LUMBAR DISC SURGERY     [place] x2     Current Meds  Medication Sig  . Ascorbic Acid (VITAMIN C) 1000 MG tablet Take 100 mg by mouth daily.  Marland Kitchen aspirin 81 MG tablet Take 81 mg by mouth daily.  Marland Kitchen atorvastatin (LIPITOR) 10 MG tablet Take 1 tablet (10 mg total) by mouth daily. DO NOT START START THIS MEDICATION UNTIL AFTER APPROVAL TO DO SO PENDING LAB WORK.  . cholecalciferol (VITAMIN D) 1000 units tablet Take 1,000 Units by mouth daily.  Marland Kitchen doxycycline (ADOXA) 50 MG tablet Take 100 mg by mouth. Every other day  . GARLIC OIL PO Take 1 tablet by mouth daily.  . Multiple Vitamins-Minerals (MULTIVITAMIN WITH MINERALS) tablet Take 1 tablet by mouth daily.    . Omega-3 Fatty Acids (FISH OIL) 1000 MG CAPS Take 1,000 mg by mouth daily.   . sildenafil (VIAGRA) 100 MG tablet Take 1 tablet (100 mg total) by mouth daily as needed for erectile dysfunction.  Current Facility-Administered Medications for the 11/17/19 encounter (Telemedicine) with Erlene Quan, PA-C  Medication  . 0.9 %  sodium chloride infusion     Allergies:   Statins   Social History   Tobacco Use  . Smoking status: Never Smoker  . Smokeless tobacco: Never Used  Substance Use Topics  . Alcohol use: Yes    Alcohol/week: 0.0 standard drinks    Comment: 3 times weekly  . Drug use: No     Family Hx: The patient's family history includes Arthritis in his paternal grandmother and sister; Cancer in his mother and paternal grandmother; Diabetes in his paternal aunt; Heart disease (age of onset: 45) in his father; Heart disease (age of onset: 73) in his paternal uncle; Heart disease (age of  onset: 48) in his paternal uncle; Hypertension in his father; Stroke in his paternal grandfather. There is no history of Colon cancer.  ROS:   Please see the history of present illness.    All other systems reviewed and are negative.   Prior CV studies:   The following studies were reviewed today: Lipids Jan 2020  Labs/Other Tests and Data Reviewed:    EKG:  An ECG dated 11/07/2018 was personally reviewed today and demonstrated:  NSR-[id number]  Recent Labs: No results found for requested labs within last 8760 hours.   Recent Lipid Panel Lab Results  Component Value Date/Time   CHOL 179 11/07/2018 10:00 AM   CHOL 173 03/25/2013 11:21 AM   TRIG 143 11/07/2018 10:00 AM   TRIG 192 (H) 03/25/2013 11:21 AM   HDL 59 11/07/2018 10:00 AM   HDL 55 03/25/2013 11:21 AM   CHOLHDL 3.0 11/07/2018 10:00 AM   CHOLHDL 3.4 12/26/2016 09:37 AM   LDLCALC 91 11/07/2018 10:00 AM   LDLCALC 80 03/25/2013 11:21 AM    Wt Readings from Last 3 Encounters:  11/17/19 235 lb (106.6 kg)  11/07/18 241 lb 6.4 oz (109.5 kg)  07/11/18 239 lb (108.4 kg)     Objective:    Vital Signs:  Ht 6\' 2"  (1.88 m)   Wt 235 lb (106.6 kg)   BMI 30.17 kg/m    VITAL SIGNS:  reviewed  ASSESSMENT & PLAN:    Fm Hx of CAD- Father had CABG in his 11's  HLD- Intolerant to high dose statin Rx  COVID-19 Education: The signs and symptoms of COVID-19 were discussed with the patient and how to seek care for testing (follow up with PCP or arrange E-visit).  The importance of social distancing was discussed today.  Time:   Today, I have spent 10 minutes with the patient with telehealth technology discussing the above problems.     Medication Adjustments/Labs and Tests Ordered: Current medicines are reviewed at length with the patient today.  Concerns regarding medicines are outlined above.   Tests Ordered: Orders Placed This Encounter  Procedures  . Lipid panel    Medication Changes: No orders of the defined types  were placed in this encounter.   Follow Up:  In Person Dr Gwenlyn Found in one year, check lipids.  I'll ask Dr Gwenlyn Found about ASA daily.  Angelena Form, PA-C  11/17/2019 9:09 AM    [facility]

## 2019-11-17 NOTE — Telephone Encounter (Signed)

## 2019-11-17 NOTE — Telephone Encounter (Signed)
Left detailed message on patients voicemail per dpr with Luke's recommendations from today's virtual office visit. Informed patient that someone from the scheduling dept will be in contact with them to schedule their follow up appt. Patient voiced understanding; AVS printed and mailed to patient.

## 2019-11-17 NOTE — Patient Instructions (Addendum)
Medication Instructions:  Your physician recommends that you continue on your current medications as directed. Please refer to the Current Medication list given to you today. *If you need a refill on your cardiac medications before your next appointment, please call your pharmacy*  Lab Work: Your physician recommends that you return for lab work in: LIPID-at your earliest [place] If you have labs (blood work) drawn today and your tests are completely normal, you will receive your results only by: Marland Kitchen MyChart Message (if you have MyChart) OR . A paper copy in the mail If you have any lab test that is abnormal or we need to change your treatment, we will call you to review the results.  Testing/Procedures: None   Follow-Up: At [facility], you and your health needs are our priority.  As part of our continuing mission to provide you with exceptional heart care, we have created designated Provider Care Teams.  These Care Teams include your primary Cardiologist (physician) and Advanced Practice Providers (APPs -  Physician Assistants and Nurse Practitioners) who all work together to provide you with the care you need, when you need it.  Your next appointment:   12 month(s)  The format for your next appointment:   In Person  Provider:   Quay Burow, MD  Other Instructions

## 2020-01-07 LAB — LIPID PANEL
Chol/HDL Ratio: 3.1 ratio (ref 0.0–5.0)
Cholesterol, Total: 141 mg/dL (ref 100–199)
HDL: 46 mg/dL (ref >39)
LDL Chol Calc (NIH): 72 mg/dL (ref 0–99)
Triglycerides: 131 mg/dL (ref 0–149)
VLDL Cholesterol Cal: 23 mg/dL (ref 5–40)

## 2020-08-11 IMAGING — CT CT HEART SCORING
2 series · 16 of 20 positions shown, 18 images · non-contrast
Comparison: Radiograph 01/30/08

Addendum:
EXAM:
OVER-READ INTERPRETATION  CT CHEST

The following report is an over-read performed by radiologist Dr.
Nelson T Nomesque [REDACTED] on 12/01/2018. This
over-read does not include interpretation of cardiac or coronary
anatomy or pathology. The calcium score interpretation by the
cardiologist is attached.
CLINICAL DATA: Risk stratification
Coronary Calcium Score
TECHNIQUE: The patient was scanned on a Siemens Force scanner. Axial
non-contrast 3 mm slices were carried out through the heart. The
data set was analyzed on a dedicated work station and scored using
the Agatson method.

[Series 3: casc 3.0 i36f 2 bestdiast 74 % · axial · 0.43mm/px · z∈[-308,-209]mm · 8 of 43 slices shown, 10 images]
[im 5/43  vessel]
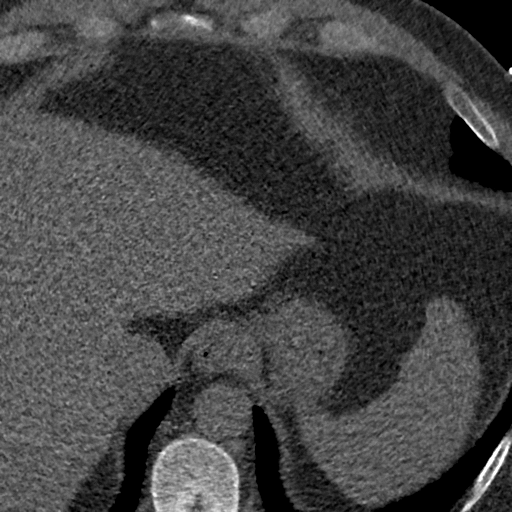
[im 5/43  lung]
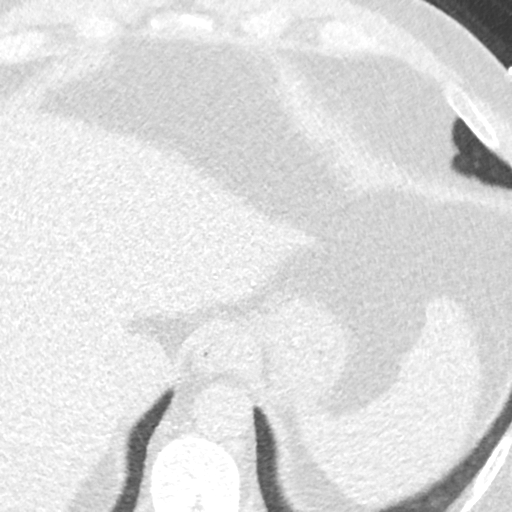
[im 10/43  vessel]
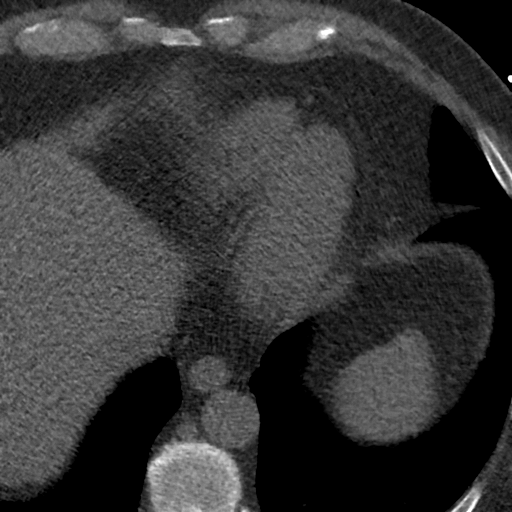
[im 15/43  vessel]
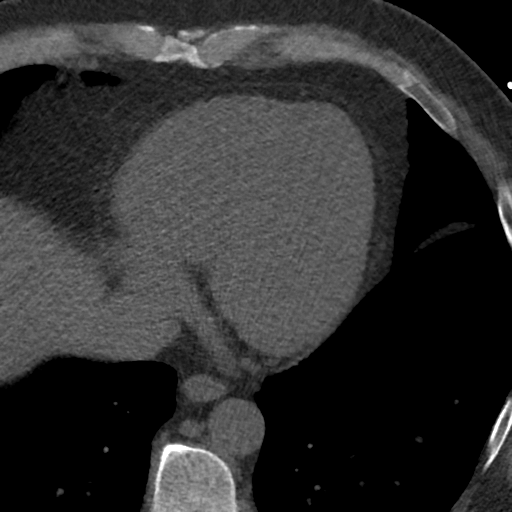
[im 19/43  vessel]
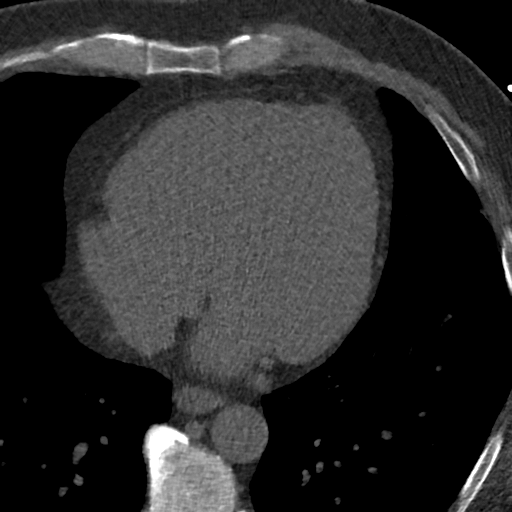
[im 24/43  vessel]
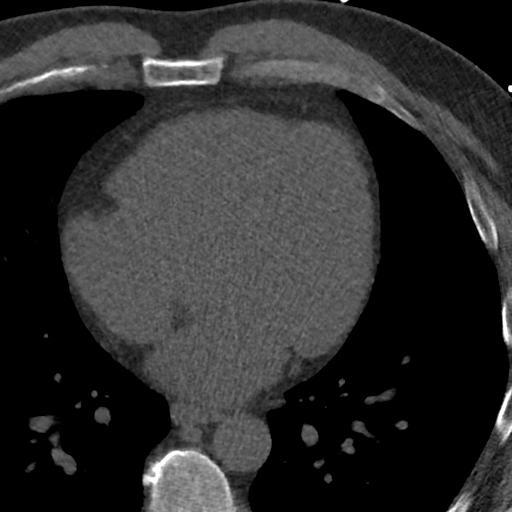
[im 24/43  lung]
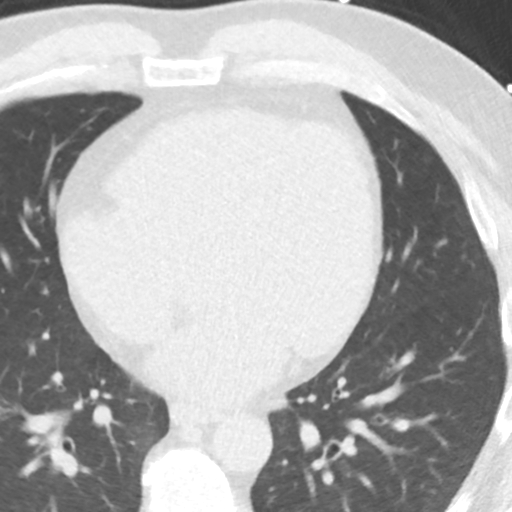
[im 29/43  vessel]
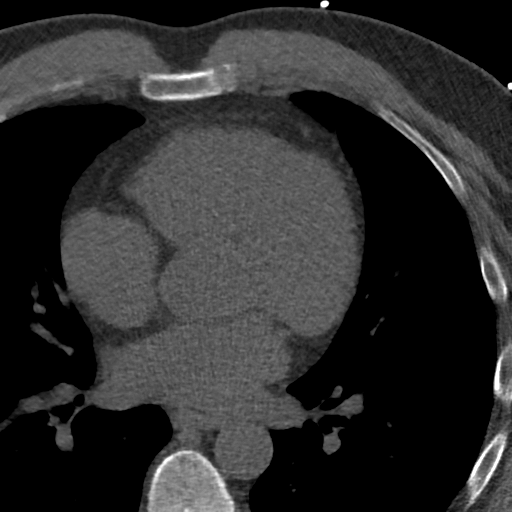
[im 33/43  vessel]
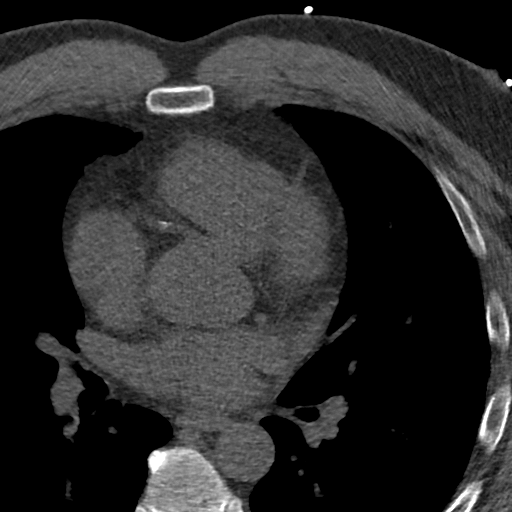
[im 38/43  vessel]
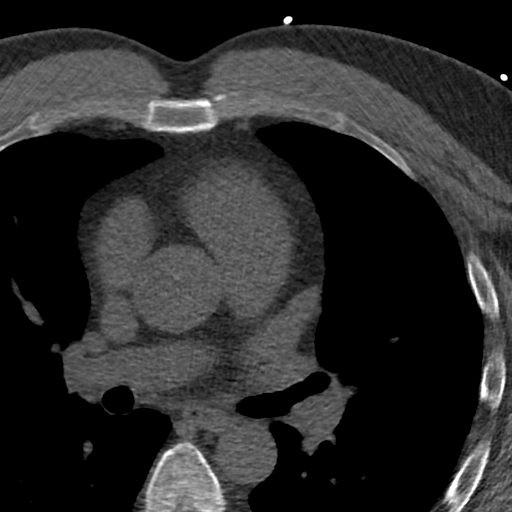

[Series 5: lung st 74 % · axial · 0.72mm/px · z∈[-308,-209]mm · 8 of 43 slices shown]
[im 5/43  lung]
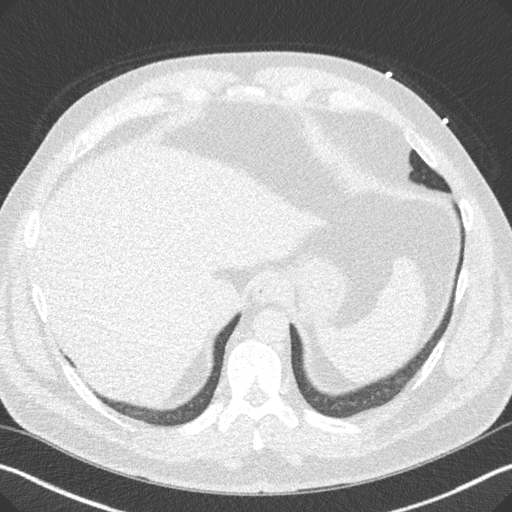
[im 10/43  lung]
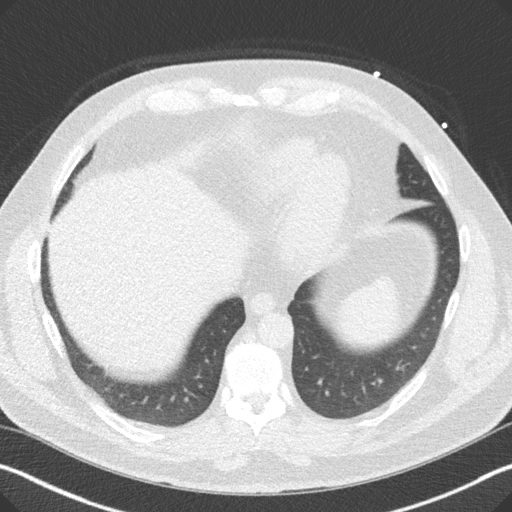
[im 15/43  lung]
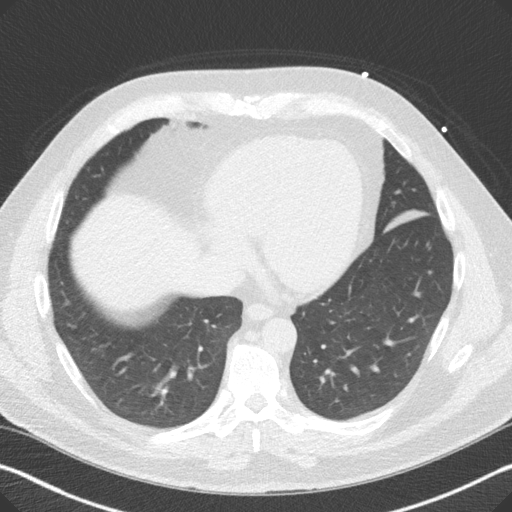
[im 19/43  lung]
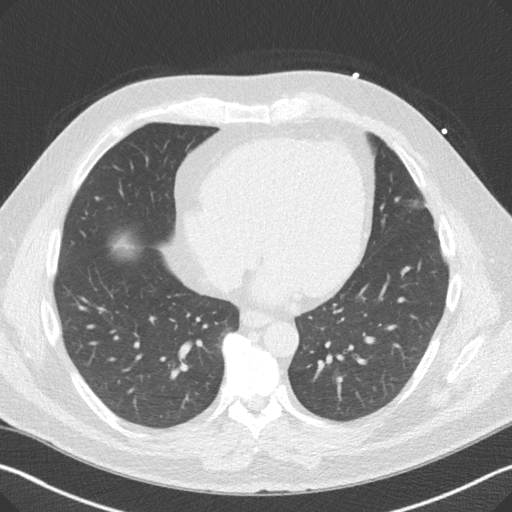
[im 24/43  lung]
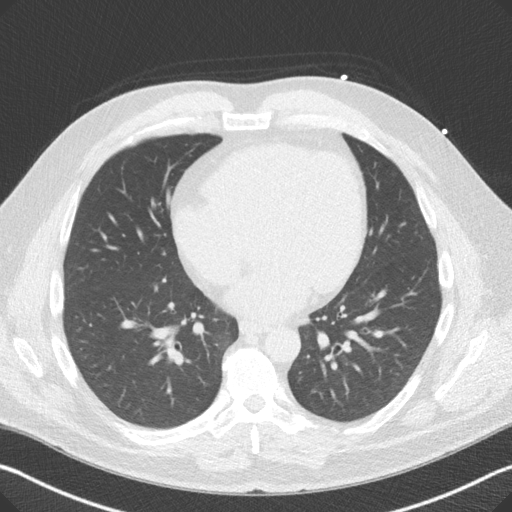
[im 29/43  lung]
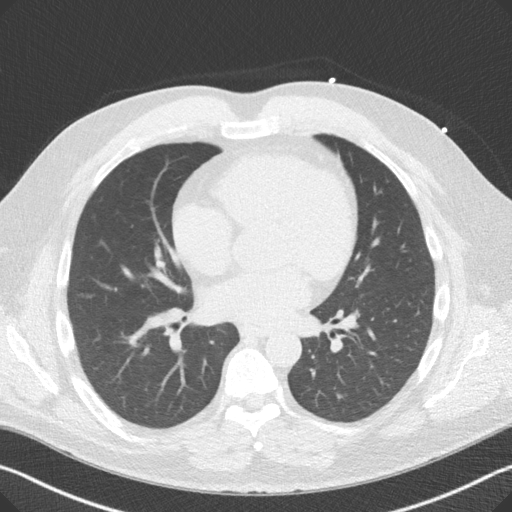
[im 33/43  lung]
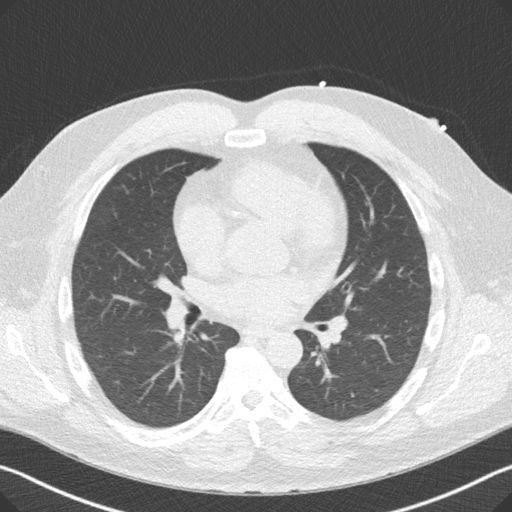
[im 38/43  lung]
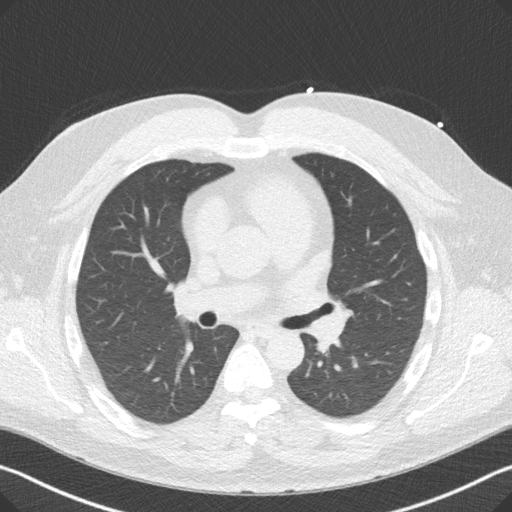

[16 of 20 positions shown; findings below may reference images not displayed]

FINDINGS: Limited view of the lung parenchyma demonstrates no suspicious
nodularity. Airways are normal.

Limited view of the mediastinum demonstrates no adenopathy.
Esophagus normal.

Limited view of the upper abdomen unremarkable.

Limited view of the skeleton and chest wall is unremarkable.
IMPRESSION: No significant extracardiac findings.
FINDINGS: Non-cardiac: See separate report from [REDACTED].

Ascending Aorta: Normal size, no calcifications.

Pericardium: Normal.

Coronary arteries: Normal origin.
IMPRESSION: Coronary calcium score of 44. This was 72 percentile for age and sex
matched control.

*** End of Addendum ***

## 2020-10-27 ENCOUNTER — Other Ambulatory Visit: Payer: Self-pay | Admitting: Cardiovascular Disease

## 2021-01-06 ENCOUNTER — Encounter: Payer: Self-pay | Admitting: Cardiovascular Disease

## 2021-01-06 ENCOUNTER — Other Ambulatory Visit: Payer: Self-pay

## 2021-01-06 ENCOUNTER — Ambulatory Visit: Payer: No Typology Code available for payment source | Admitting: Cardiovascular Disease

## 2021-01-06 DIAGNOSIS — R931 Abnormal findings on diagnostic imaging of heart and coronary circulation: Secondary | ICD-10-CM

## 2021-01-06 DIAGNOSIS — E782 Mixed hyperlipidemia: Secondary | ICD-10-CM

## 2021-01-06 DIAGNOSIS — I10 Essential (primary) hypertension: Secondary | ICD-10-CM | POA: Diagnosis not present

## 2021-01-06 LAB — PSA: Prostate Specific Ag, Serum: 1.2 ng/mL (ref 0.0–4.0)

## 2021-01-06 LAB — LIPID PANEL
HDL: 54 mg/dL (ref >39)
VLDL Cholesterol Cal: 29 mg/dL (ref 5–40)

## 2021-01-06 LAB — CBC: MCV: 90 fL (ref 79–97)

## 2021-01-06 LAB — COMPREHENSIVE METABOLIC PANEL
CO2: 25 mmol/L (ref 20–29)
Globulin, Total: 2.5 g/dL (ref 1.5–4.5)

## 2021-01-06 LAB — TSH+FREE T4: TSH: 1.58 u[IU]/mL (ref 0.450–4.500)

## 2021-01-06 NOTE — Assessment & Plan Note (Signed)
Elevated coronary calcium score of 44 performed 12/01/2018.  He is on statin therapy with an LDL of 72.  He is completely asymptomatic.

## 2021-01-06 NOTE — Patient Instructions (Signed)
Medication Instructions:  Your physician recommends that you continue on your current medications as directed. Please refer to the Current Medication list given to you today.   *If you need a refill on your cardiac medications before your next appointment, please call your pharmacy*   Lab Work: Your physician recommends that you have labs drawn today: CMET, CBC, TSH, A1C, PSA  If you have labs (blood work) drawn today and your tests are completely normal, you will receive your results only by: Marland Kitchen MyChart Message (if you have MyChart) OR . A paper copy in the mail If you have any lab test that is abnormal or we need to change your treatment, we will call you to review the results.   Follow-Up: At [facility], you and your health needs are our priority.  As part of our continuing mission to provide you with exceptional heart care, we have created designated Provider Care Teams.  These Care Teams include your primary Cardiologist (physician) and Advanced Practice Providers (APPs -  Physician Assistants and Nurse Practitioners) who all work together to provide you with the care you need, when you need it.  We recommend signing up for the patient portal called "MyChart".  Sign up information is provided on this After Visit Summary.  MyChart is used to connect with patients for Virtual Visits (Telemedicine).  Patients are able to view lab/test results, encounter notes, upcoming appointments, etc.  Non-urgent messages can be sent to your provider as well.   To learn more about what you can do with MyChart, go to [email or web address].    Your next appointment:   12 month(s)  The format for your next appointment:   In Person  Provider:   Quay Burow, MD

## 2021-01-06 NOTE — Progress Notes (Signed)
01/06/2021 Patrick Byrd.   1966/06/08  [id number]  Primary Physician Denita Lung, MD Primary Cardiologist: Lorretta Harp MD FACP, [place], [place], [place]  HPI:  Patrick Byrd. is a 55 y.o.   moderately overweight married Caucasian male father of 3 children grandfather of 1 grandchild, who I last saw in the office  11/07/2018. 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  He recently returned from [place] last night and has traveled to [place] and [place].  He was on a plane on a weekly basis.  He retired in November of last year and moved to [place]. He has a history of hyperlipidemia and a positive family history of heart disease with a father who had bypass surgery at age 41. He is completely asymptomatic. His last Myoview performed 12/16/09 was nonischemic.  As I saw him about 2 years ago he continues to do well.  Since he is retired he exercises on a daily basis lifting dumbbells and riding his bike on the beach and walking with his wife.  He is completely asymptomatic.  No outpatient medications have been marked as taking for the 01/06/21 encounter (Office Visit) with Lorretta Harp, MD.   Current Facility-Administered Medications for the 01/06/21 encounter (Office Visit) with Lorretta Harp, MD  Medication  . 0.9 %  sodium chloride infusion     Allergies  Allergen Reactions  . Statins Other (See Comments)    Intolerance to high dose statin Rx    Social History   Socioeconomic History  . Marital status: Married    Spouse name: Not on file  . Number of children: Not on file  . Years of education: Not on file  . Highest education level: Not on file  Occupational History  . Not on file  Tobacco Use  . Smoking status: Never Smoker  . Smokeless tobacco: Never Used  Vaping Use  . Vaping Use: Never used  Substance and Sexual Activity   . Alcohol use: Yes    Alcohol/week: 0.0 standard drinks    Comment: 3 times weekly  . Drug use: No  . Sexual activity: Not on file  Other Topics Concern  . Not on file  Social History Narrative   ** Merged History Encounter **       Married, works in [employer], Public relations account executive.  3 children.  Exercise - walk, cycling some.     Social Determinants of Health   Financial Resource Strain: Not on file  Food Insecurity: Not on file  Transportation Needs: Not on file  Physical Activity: Not on file  Stress: Not on file  Social Connections: Not on file  Intimate Partner Violence: Not on file     Review of Systems: General: negative for chills, fever, night sweats or weight changes.  Cardiovascular: negative for chest pain, dyspnea on exertion, edema, orthopnea, palpitations, paroxysmal nocturnal dyspnea or shortness of breath Dermatological: negative for rash Respiratory: negative for cough or wheezing Urologic: negative for hematuria Abdominal: negative for nausea, vomiting, diarrhea, bright red blood per rectum, melena, or hematemesis Neurologic: negative for visual changes, syncope, or dizziness All other systems reviewed and are otherwise negative except as noted above.    Blood pressure 122/80, pulse 70, height 6\' 2"  (1.88 m), weight 236 lb 9.6 oz (107.3 kg), SpO2 97 %.  General appearance: alert and no distress  Neck: no adenopathy, no carotid bruit, no JVD, supple, symmetrical, trachea midline and thyroid not enlarged, symmetric, no tenderness/mass/nodules Lungs: clear to auscultation bilaterally Heart: regular rate and rhythm, S1, S2 normal, no murmur, click, rub or gallop Extremities: extremities normal, atraumatic, no cyanosis or edema Pulses: 2+ and symmetric Skin: Skin color, texture, turgor normal. No rashes or lesions Neurologic: Alert and oriented X 3, normal strength and tone. Normal symmetric reflexes. Normal coordination and gait  EKG sinus  rhythm at 70 without ST or T wave changes.  I personally reviewed this EKG.  ASSESSMENT AND PLAN:   Hyperlipidemia History of hyperlipidemia on statin therapy with lipid profile performed 1 year ago revealing total cholesterol 141, LDL 72 and HDL of 46.  We will recheck lipid and liver profile.  Essential hypertension History of essential hypertension a blood pressure measured today 122/80.  He is not on antihypertensive medications.  Elevated coronary artery calcium score Elevated coronary calcium score of 44 performed 12/01/2018.  He is on statin therapy with an LDL of 72.  He is completely asymptomatic.      Lorretta Harp MD [place], [facility] 01/06/2021 8:20 AM

## 2021-01-06 NOTE — Assessment & Plan Note (Signed)
History of essential hypertension a blood pressure measured today 122/80.  He is not on antihypertensive medications.

## 2021-01-06 NOTE — Assessment & Plan Note (Signed)
History of hyperlipidemia on statin therapy with lipid profile performed 1 year ago revealing total cholesterol 141, LDL 72 and HDL of 46.  We will recheck lipid and liver profile.

## 2021-01-07 LAB — COMPREHENSIVE METABOLIC PANEL
ALT: 28 IU/L (ref 0–44)
AST: 26 IU/L (ref 0–40)
Albumin/Globulin Ratio: 1.8 (ref 1.2–2.2)
Albumin: 4.6 g/dL (ref 3.8–4.9)
Alkaline Phosphatase: 94 IU/L (ref 44–121)
BUN/Creatinine Ratio: 12 (ref 9–20)
BUN: 13 mg/dL (ref 6–24)
Bilirubin Total: 0.8 mg/dL (ref 0.0–1.2)
Calcium: 9.8 mg/dL (ref 8.7–10.2)
Chloride: 101 mmol/L (ref 96–106)
Creatinine, Ser: 1.11 mg/dL (ref 0.76–1.27)
Glucose: 97 mg/dL (ref 65–99)
Potassium: 4.6 mmol/L (ref 3.5–5.2)
Sodium: 140 mmol/L (ref 134–144)
Total Protein: 7.1 g/dL (ref 6.0–8.5)
eGFR: 78 mL/min/{1.73_m2} (ref >59)

## 2021-01-07 LAB — CBC
Hematocrit: 44 % (ref 37.5–51.0)
Hemoglobin: 15.3 g/dL (ref 13.0–17.7)
MCH: 31.4 pg (ref 26.6–33.0)
MCHC: 34.8 g/dL (ref 31.5–35.7)
Platelets: 251 10*3/uL (ref 150–450)
RBC: 4.88 x10E6/uL (ref 4.14–5.80)
RDW: 11.8 % (ref 11.6–15.4)
WBC: 5.8 10*3/uL (ref 3.4–10.8)

## 2021-01-07 LAB — LIPID PANEL
Chol/HDL Ratio: 2.9 ratio (ref 0.0–5.0)
Cholesterol, Total: 156 mg/dL (ref 100–199)
LDL Chol Calc (NIH): 73 mg/dL (ref 0–99)
Triglycerides: 171 mg/dL — ABNORMAL HIGH (ref 0–149)

## 2021-01-07 LAB — HEMOGLOBIN A1C
Est. average glucose Bld gHb Est-mCnc: 111 mg/dL
Hgb A1c MFr Bld: 5.5 % (ref 4.8–5.6)

## 2021-01-07 LAB — TSH+FREE T4: Free T4: 1.52 ng/dL (ref 0.82–1.77)

## 2021-05-02 ENCOUNTER — Other Ambulatory Visit: Payer: Self-pay | Admitting: Cardiovascular Disease

## 2021-10-08 DIAGNOSIS — I10 Essential (primary) hypertension: Secondary | ICD-10-CM

## 2021-10-08 HISTORY — DX: Essential (primary) hypertension: I10

## 2021-10-26 ENCOUNTER — Other Ambulatory Visit: Payer: Self-pay | Admitting: Cardiovascular Disease

## 2022-01-23 ENCOUNTER — Ambulatory Visit: Payer: No Typology Code available for payment source | Admitting: Cardiovascular Disease

## 2022-01-26 ENCOUNTER — Ambulatory Visit: Payer: No Typology Code available for payment source | Admitting: Cardiovascular Disease

## 2022-04-19 ENCOUNTER — Other Ambulatory Visit (HOSPITAL_COMMUNITY): Payer: Self-pay

## 2022-04-19 MED ORDER — DOXYCYCLINE HYCLATE 100 MG PO TABS
100.0000 mg | ORAL_TABLET | Freq: Two times a day (BID) | ORAL | 3 refills | Status: DC
  Filled 2022-04-19: qty 60, 30d supply, fill #0

## 2022-04-19 MED ORDER — ATORVASTATIN CALCIUM 10 MG PO TABS
10.0000 mg | ORAL_TABLET | Freq: Every day | ORAL | 3 refills | Status: DC
  Filled 2022-04-19: qty 90, 90d supply, fill #0
  Filled 2022-07-12: qty 30, 30d supply, fill #1
  Filled 2022-08-03: qty 90, 90d supply, fill #2
  Filled 2022-12-19: qty 90, 90d supply, fill #3
  Filled 2023-03-14: qty 30, 30d supply, fill #4
  Filled 2023-04-16: qty 30, 30d supply, fill #5

## 2022-04-19 MED ORDER — IRBESARTAN 300 MG PO TABS
300.0000 mg | ORAL_TABLET | Freq: Every day | ORAL | 3 refills | Status: DC
  Filled 2022-04-19: qty 90, 90d supply, fill #0
  Filled 2022-07-12: qty 30, 30d supply, fill #1
  Filled 2022-08-03: qty 90, 90d supply, fill #2
  Filled 2022-10-20: qty 90, 90d supply, fill #3

## 2022-04-19 MED ORDER — AMLODIPINE BESYLATE 5 MG PO TABS
5.0000 mg | ORAL_TABLET | Freq: Every day | ORAL | 3 refills | Status: DC
  Filled 2022-04-19: qty 90, 90d supply, fill #0
  Filled 2022-07-12: qty 90, 90d supply, fill #1
  Filled 2022-10-20: qty 90, 90d supply, fill #2

## 2022-04-20 ENCOUNTER — Other Ambulatory Visit (HOSPITAL_COMMUNITY): Payer: Self-pay

## 2022-05-03 ENCOUNTER — Encounter: Payer: Self-pay | Admitting: Gastroenterology

## 2022-07-12 ENCOUNTER — Other Ambulatory Visit (HOSPITAL_COMMUNITY): Payer: Self-pay

## 2022-07-30 ENCOUNTER — Other Ambulatory Visit (HOSPITAL_COMMUNITY): Payer: Self-pay

## 2022-08-03 ENCOUNTER — Other Ambulatory Visit (HOSPITAL_COMMUNITY): Payer: Self-pay

## 2022-10-20 ENCOUNTER — Other Ambulatory Visit: Payer: Self-pay

## 2022-10-20 ENCOUNTER — Other Ambulatory Visit (HOSPITAL_COMMUNITY): Payer: Self-pay

## 2022-10-22 ENCOUNTER — Other Ambulatory Visit (HOSPITAL_COMMUNITY): Payer: Self-pay

## 2022-10-22 MED ORDER — IRBESARTAN 300 MG PO TABS
300.0000 mg | ORAL_TABLET | Freq: Every day | ORAL | 3 refills | Status: DC
  Filled 2022-10-22: qty 90, 90d supply, fill #0
  Filled 2022-12-24: qty 90, 90d supply, fill #1
  Filled 2023-04-22: qty 90, 90d supply, fill #2
  Filled 2023-07-17: qty 90, 90d supply, fill #3

## 2022-10-26 ENCOUNTER — Other Ambulatory Visit (HOSPITAL_COMMUNITY): Payer: Self-pay

## 2022-12-19 ENCOUNTER — Other Ambulatory Visit (HOSPITAL_COMMUNITY): Payer: Self-pay

## 2022-12-21 ENCOUNTER — Other Ambulatory Visit (HOSPITAL_COMMUNITY): Payer: Self-pay

## 2022-12-24 ENCOUNTER — Other Ambulatory Visit (HOSPITAL_COMMUNITY): Payer: Self-pay

## 2022-12-24 MED ORDER — AMLODIPINE BESYLATE 5 MG PO TABS
5.0000 mg | ORAL_TABLET | Freq: Every day | ORAL | 3 refills | Status: DC
  Filled 2022-12-24 – 2023-01-03 (×3): qty 90, 90d supply, fill #0
  Filled 2023-04-20 – 2023-04-22 (×2): qty 90, 90d supply, fill #1
  Filled 2023-07-17: qty 90, 90d supply, fill #2
  Filled 2023-10-19: qty 90, 90d supply, fill #3

## 2022-12-26 ENCOUNTER — Other Ambulatory Visit (HOSPITAL_COMMUNITY): Payer: Self-pay

## 2023-01-03 ENCOUNTER — Other Ambulatory Visit (HOSPITAL_COMMUNITY): Payer: Self-pay

## 2023-03-14 ENCOUNTER — Other Ambulatory Visit: Payer: Self-pay

## 2023-03-14 ENCOUNTER — Other Ambulatory Visit (HOSPITAL_COMMUNITY): Payer: Self-pay

## 2023-04-15 ENCOUNTER — Encounter: Payer: Self-pay | Admitting: Medical

## 2023-04-15 ENCOUNTER — Ambulatory Visit (INDEPENDENT_AMBULATORY_CARE_PROVIDER_SITE_OTHER): Payer: 59 | Admitting: Medical

## 2023-04-15 VITALS — BP 130/80 | HR 90 | Ht 74.0 in | Wt 238.6 lb

## 2023-04-15 DIAGNOSIS — Z0000 Encounter for general adult medical examination without abnormal findings: Secondary | ICD-10-CM | POA: Diagnosis not present

## 2023-04-15 DIAGNOSIS — Z9889 Other specified postprocedural states: Secondary | ICD-10-CM | POA: Diagnosis not present

## 2023-04-15 DIAGNOSIS — E782 Mixed hyperlipidemia: Secondary | ICD-10-CM | POA: Diagnosis not present

## 2023-04-15 DIAGNOSIS — Z1211 Encounter for screening for malignant neoplasm of colon: Secondary | ICD-10-CM

## 2023-04-15 DIAGNOSIS — E559 Vitamin D deficiency, unspecified: Secondary | ICD-10-CM | POA: Diagnosis not present

## 2023-04-15 DIAGNOSIS — D485 Neoplasm of uncertain behavior of skin: Secondary | ICD-10-CM | POA: Diagnosis not present

## 2023-04-15 DIAGNOSIS — Z131 Encounter for screening for diabetes mellitus: Secondary | ICD-10-CM

## 2023-04-15 DIAGNOSIS — K429 Umbilical hernia without obstruction or gangrene: Secondary | ICD-10-CM

## 2023-04-15 DIAGNOSIS — L570 Actinic keratosis: Secondary | ICD-10-CM | POA: Diagnosis not present

## 2023-04-15 DIAGNOSIS — I10 Essential (primary) hypertension: Secondary | ICD-10-CM

## 2023-04-15 DIAGNOSIS — Z23 Encounter for immunization: Secondary | ICD-10-CM

## 2023-04-15 DIAGNOSIS — Z125 Encounter for screening for malignant neoplasm of prostate: Secondary | ICD-10-CM

## 2023-04-15 DIAGNOSIS — Z8249 Family history of ischemic heart disease and other diseases of the circulatory system: Secondary | ICD-10-CM

## 2023-04-15 DIAGNOSIS — L821 Other seborrheic keratosis: Secondary | ICD-10-CM | POA: Diagnosis not present

## 2023-04-15 DIAGNOSIS — L819 Disorder of pigmentation, unspecified: Secondary | ICD-10-CM | POA: Diagnosis not present

## 2023-04-15 DIAGNOSIS — D225 Melanocytic nevi of trunk: Secondary | ICD-10-CM | POA: Diagnosis not present

## 2023-04-15 DIAGNOSIS — L814 Other melanin hyperpigmentation: Secondary | ICD-10-CM | POA: Diagnosis not present

## 2023-04-15 LAB — CBC WITH DIFFERENTIAL/PLATELET
Basophils Absolute: 0.1 10*3/uL (ref 0.0–0.2)
Hemoglobin: 14.8 g/dL (ref 13.0–17.7)
Immature Grans (Abs): 0 10*3/uL (ref 0.0–0.1)
Monocytes Absolute: 0.8 10*3/uL (ref 0.1–0.9)
Monocytes: 10 %
Neutrophils Absolute: 4.6 10*3/uL (ref 1.4–7.0)
Neutrophils: 57 %
Platelets: 295 10*3/uL (ref 150–450)

## 2023-04-15 LAB — POCT URINALYSIS DIP (PROADVANTAGE DEVICE)
Bilirubin, UA: NEGATIVE
Blood, UA: NEGATIVE
Glucose, UA: NEGATIVE mg/dL
Ketones, POC UA: NEGATIVE mg/dL
Leukocytes, UA: NEGATIVE
Nitrite, UA: NEGATIVE
Protein Ur, POC: NEGATIVE mg/dL
Specific Gravity, Urine: 1.02
Urobilinogen, Ur: NEGATIVE
pH, UA: 6 (ref 5.0–8.0)

## 2023-04-15 LAB — COMPREHENSIVE METABOLIC PANEL

## 2023-04-15 LAB — PSA, TOTAL AND FREE

## 2023-04-15 NOTE — Patient Instructions (Signed)
This visit was a preventative care visit, also known as wellness visit or routine physical.   Topics typically include healthy lifestyle, diet, exercise, preventative care, vaccinations, sick and well care, proper use of emergency dept and after hours care, as well as other concerns.     Separate significant issues discussed: Plan to get your cholesterol checked fasting  Expect a phone call about scheduling colonoscopy  Continue routine follow up with cardiology    General Recommendations: Continue to return yearly for your annual wellness and preventative care visits.  This gives [place] a chance to discuss healthy lifestyle, exercise, vaccinations, review your chart record, and perform screenings where appropriate.  I recommend you see your eye doctor yearly for routine vision care.  I recommend you see your dentist yearly for routine dental care including hygiene visits twice yearly.   Vaccination  Immunization History  Administered Date(s) Administered   Influenza,inj,Quad PF,6+ Mos 08/14/2019   Influenza-Unspecified 08/20/2021   PPD Test 11/19/2001   Tdap 11/18/2009, 01/18/2013   Zoster Recombinant(Shingrix) 07/09/2020, 11/11/2020    Counseled on the Tdap (tetanus, diptheria, and acellular pertussis) vaccine.  Vaccine information sheet given. Tdap vaccine given after consent obtained.    Screening for cancer: Colon cancer screening: We will refer you for screening colonoscopy  Prostate Cancer screening: The recommended prostate cancer screening test is a blood test called the prostate-specific antigen (PSA) test. PSA is a protein that is made in the prostate. As you age, your prostate naturally produces more PSA. Abnormally high PSA levels may be caused by: Prostate cancer. An enlarged prostate that is not caused by cancer (benign prostatic hyperplasia, or BPH). This condition is very common in older men. A prostate gland infection (prostatitis) or urinary tract  infection. Certain medicines such as male hormones (like testosterone) or other medicines that raise testosterone levels. A rectal exam may be done as part of prostate cancer screening to help provide information about the size of your prostate gland. When a rectal exam is performed, it should be done after the PSA level is drawn to avoid any effect on the results.   Skin cancer screening: Check your skin regularly for new changes, growing lesions, or other lesions of concern Come in for evaluation if you have skin lesions of concern.   Lung cancer screening: If you have a greater than 20 pack year history of tobacco use, then you may qualify for lung cancer screening with a chest CT scan.   Please call your insurance company to inquire about coverage for this test.   Pancreatic cancer:  no current screening test is available or routinely recommended. (risk factors: smoking, overweight or obese, diabetes, chronic pancreatitis, work exposure - dry cleaning, metal working, 57yo>, M>F, Tree surgeon, family hx/o, hereditary breast, ovarian, melanoma, lynch, peutz-jeghers).  Symptoms: jaundice, dark urine, light color or greasy stools, itchy skin, belly or back pain, weight loss, poor appetite, nause, vomiting, liver enlargement, DVT/blood clots.   We currently don't have screenings for other cancers besides breast, cervical, colon, and lung cancers.  If you have a strong family history of cancer or have other cancer screening concerns, please let me know.  Genetic testing referral is an option for individuals with high cancer risk in the family.  There are some other cancer screenings in development currently.   Bone health: Get at least 150 minutes of aerobic exercise weekly Get weight bearing exercise at least once weekly Bone density test:  A bone density test is an imaging test  that uses a type of X-ray to measure the amount of calcium and other minerals in your bones. The test may be  used to diagnose or screen you for a condition that causes weak or thin bones (osteoporosis), predict your risk for a broken bone (fracture), or determine how well your osteoporosis treatment is working. The bone density test is recommended for females 65 and older, or females or males <65 if certain risk factors such as thyroid disease, long term use of steroids such as for asthma or rheumatological issues, vitamin D deficiency, estrogen deficiency, family history of osteoporosis, self or family history of fragility fracture in first degree relative.    Heart health: Get at least 150 minutes of aerobic exercise weekly Limit alcohol It is important to maintain a healthy blood pressure and healthy cholesterol numbers  Heart disease screening: Screening for heart disease includes screening for blood pressure, fasting lipids, glucose/diabetes screening, BMI height to weight ratio, reviewed of smoking status, physical activity, and diet.    Goals include blood pressure 120/80 or less, maintaining a healthy lipid/cholesterol profile, preventing diabetes or keeping diabetes numbers under good control, not smoking or using tobacco products, exercising most days per week or at least 150 minutes per week of exercise, and eating healthy variety of fruits and vegetables, healthy oils, and avoiding unhealthy food choices like fried food, fast food, high sugar and high cholesterol foods.    Other tests may possibly include EKG test, CT coronary calcium score, echocardiogram, exercise treadmill stress test.       Vascular disease screening: For higher risk individuals including smokers, diabetics, patients with known heart disease or high blood pressure, kidney disease, and others, screening for vascular disease or atherosclerosis of the arteries is available.  Examples may include carotid ultrasound, abdominal aortic ultrasound, ABI blood flow screening in the legs, thoracic aorta screening.    Medical  care options: I recommend you continue to seek care here first for routine care.  We try really hard to have available appointments Monday through Friday daytime hours for sick visits, acute visits, and physicals.  Urgent care should be used for after hours and weekends for significant issues that cannot wait till the next day.  The emergency department should be used for significant potentially life-threatening emergencies.  The emergency department is expensive, can often have long wait times for less significant concerns, so try to utilize primary care, urgent care, or telemedicine when possible to avoid unnecessary trips to the emergency department.  Virtual visits and telemedicine have been introduced since the pandemic started in 2020, and can be convenient ways to receive medical care.  We offer virtual appointments as well to assist you in a variety of options to seek medical care.   Legal  Take the time to do a last will and testament, Advanced Directives including Health Care Power of Attorney and Living Will documents.  Don't leave your family with burdens that can be handled ahead of time.   Advanced Directives: I recommend you consider completing a Health Care Power of Attorney and Living Will.   These documents respect your wishes and help alleviate burdens on your loved ones if you were to become terminally ill or be in a position to need those documents enforced.    You can complete Advanced Directives yourself, have them notarized, then have copies made for our office, for you and for anybody you feel should have them in safe keeping.  Or, you can have an attorney prepare  these documents.   If you haven't updated your Last Will and Testament in a while, it may be worthwhile having an attorney prepare these documents together and save on some costs.       Spiritual and Emotional Health Keeping a healthy spiritual life can help you better manage your physical health. Your spiritual  life can help you to cope with any issues that may arise with your physical health.  Balance can keep [place] healthy and help [place] to recover.  If you are struggling with your spiritual health there are questions that you may want to ask yourself:  What makes me feel most complete? When do I feel most connected to the rest of the world? Where do I find the most inner strength? What am I doing when I feel whole?  Helpful tips: Being in nature. Some people feel very connected and at peace when they are walking outdoors or are outside. Helping others. Some feel the largest sense of wellbeing when they are of service to others. Being of service can take on many forms. It can be doing volunteer work, being kind to strangers, or offering a hand to a friend in need. Gratitude. Some people find they feel the most connected when they remain grateful. They may make lists of all the things they are grateful for or say a thank you out loud for all they have.

## 2023-04-15 NOTE — Progress Notes (Signed)
Subjective:   HPI  Patrick Byrd. is a 57 y.o. male who presents for Chief Complaint  Patient presents with   nonfasting cpe    Nonfasting cpe, no concerns    Patient Care Team: Jodee Wagenaar, Kermit Balo, PA-C as PCP - General (Family Medicine) Runell Gess, MD as PCP - Cardiology (Cardiology) Dr. Robynn Pane, cardiology in [place] primarily Dr. Karlyn Agee, dermatology Sees dentist here in [place]   Concerns: Here for a well visit.  Last visit here 2019, primarily lives and gets his health care in [place], but on [employer] who works for [employer].    HTN - Compliant with medication, just started on medication 2023  Hyperlipidemia - prior myalgias with some statin, but tolerates atorvastatin 10mg  daily  Compliant with vit D supplement   Reviewed their medical, surgical, family, social, medication, and allergy history and updated chart as appropriate.  Allergies  Allergen Reactions   Statins Other (See Comments)    Intolerance to high dose statin Rx    Past Medical History:  Diagnosis Date   Allergic rhinitis    ED (erectile dysfunction)    Family history of premature CAD    History of cardiovascular stress test 12/16/2009   Myoview, nonischemic; Dr. Allyson Sabal   Hyperlipidemia    Hypertension 2023   pt denies   Mixed dyslipidemia    Obesity    Rosacea    sees dermatology   Vitamin D deficiency     Current Outpatient Medications on File Prior to Visit  Medication Sig Dispense Refill   amLODipine (NORVASC) 5 MG tablet Take 1 tablet (5 mg total) by mouth daily. 90 tablet 3   APPLE CIDER VINEGAR PO Take by mouth.     Ascorbic Acid (VITAMIN C) 1000 MG tablet Take 100 mg by mouth daily.     aspirin 81 MG tablet Take 81 mg by mouth daily.     atorvastatin (LIPITOR) 10 MG tablet Take 1 tablet (10 mg total) by mouth daily. 90 tablet 3   cholecalciferol (VITAMIN D) 1000 units tablet Take 1,000 Units by mouth daily.     irbesartan (AVAPRO) 300 MG tablet Take 1 tablet  (300 mg total) by mouth daily. 90 tablet 3   Multiple Vitamins-Minerals (MULTIVITAMIN WITH MINERALS) tablet Take 1 tablet by mouth daily.       Omega-3 Fatty Acids (FISH OIL) 1000 MG CAPS Take 1,000 mg by mouth daily.      No current facility-administered medications on file prior to visit.      Current Outpatient Medications:    amLODipine (NORVASC) 5 MG tablet, Take 1 tablet (5 mg total) by mouth daily., Disp: 90 tablet, Rfl: 3   APPLE CIDER VINEGAR PO, Take by mouth., Disp: , Rfl:    Ascorbic Acid (VITAMIN C) 1000 MG tablet, Take 100 mg by mouth daily., Disp: , Rfl:    aspirin 81 MG tablet, Take 81 mg by mouth daily., Disp: , Rfl:    atorvastatin (LIPITOR) 10 MG tablet, Take 1 tablet (10 mg total) by mouth daily., Disp: 90 tablet, Rfl: 3   cholecalciferol (VITAMIN D) 1000 units tablet, Take 1,000 Units by mouth daily., Disp: , Rfl:    irbesartan (AVAPRO) 300 MG tablet, Take 1 tablet (300 mg total) by mouth daily., Disp: 90 tablet, Rfl: 3   Multiple Vitamins-Minerals (MULTIVITAMIN WITH MINERALS) tablet, Take 1 tablet by mouth daily.  , Disp: , Rfl:    Omega-3 Fatty Acids (FISH OIL) 1000 MG CAPS, Take 1,000  mg by mouth daily. , Disp: , Rfl:   Family History  Problem Relation Age of Onset   Hypertension Father    Heart disease Father 46       CABG   Arthritis Sister    Cancer Paternal Grandmother        brain   Arthritis Paternal Grandmother    Stroke Paternal Grandfather    Cancer Mother        skin   Diabetes Paternal Aunt    Heart disease Paternal Uncle 66       MI   Heart disease Paternal Uncle 54       MI   Colon cancer Neg Hx     Past Surgical History:  Procedure Laterality Date   COLONOSCOPY  02/2017   Dr. Marsa Aris   LUMBAR DISC SURGERY     [facility] x2    Review of Systems  Constitutional:  Negative for chills, fever, malaise/fatigue and weight loss.  HENT:  Negative for congestion, ear pain, hearing loss, sore throat and tinnitus.   Eyes:  Negative for  blurred vision, pain and redness.  Respiratory:  Negative for cough, hemoptysis and shortness of breath.   Cardiovascular:  Negative for chest pain, palpitations, orthopnea, claudication and leg swelling.  Gastrointestinal:  Negative for abdominal pain, blood in stool, constipation, diarrhea, nausea and vomiting.  Genitourinary:  Negative for dysuria, flank pain, frequency, hematuria and urgency.  Musculoskeletal:  Negative for falls, joint pain and myalgias.  Skin:  Negative for itching and rash.  Neurological:  Negative for dizziness, tingling, speech change, weakness and headaches.  Endo/Heme/Allergies:  Negative for polydipsia. Does not bruise/bleed easily.  Psychiatric/Behavioral:  Negative for depression and memory loss. The patient is not nervous/anxious and does not have insomnia.      Objective:  BP 130/80   Pulse 90   Ht 6\' 2"  (1.88 m)   Wt 238 lb 9.6 oz (108.2 kg)   BMI 30.63 kg/m   General appearance: alert, no distress, WD/WN, Caucasian male Skin: unremarkable HEENT: normocephalic, conjunctiva/corneas normal, sclerae anicteric, PERRLA, EOMi, nares patent, no discharge or erythema, pharynx normal Oral cavity: MMM, tongue normal, teeth normal Neck: supple, no lymphadenopathy, no thyromegaly, no masses, normal ROM, no bruits Chest: non tender, normal shape and expansion Heart: RRR, normal S1, S2, no murmurs Lungs: CTA bilaterally, no wheezes, rhonchi, or rales Abdomen: +bs, soft, small reducible umbilical hernia, otherwise non tender, non distended, no masses, no hepatomegaly, no splenomegaly, no bruits Back: non tender, normal ROM, no scoliosis Musculoskeletal: upper extremities non tender, no obvious deformity, normal ROM throughout, lower extremities non tender, no obvious deformity, normal ROM throughout Extremities: no edema, no cyanosis, no clubbing Pulses: 2+ symmetric, upper and lower extremities, normal cap refill Neurological: alert, oriented x 3, CN2-12 intact,  strength normal upper extremities and lower extremities, sensation normal throughout, DTRs 2+ throughout, no cerebellar signs, gait normal Psychiatric: normal affect, behavior normal, pleasant  GU/deferred    Assessment and Plan :   Encounter Diagnoses  Name Primary?   Encounter for health maintenance examination in adult Yes   Need for Tdap vaccination    Screen for colon cancer    Vitamin D deficiency    Mixed hyperlipidemia    Family history of early CAD    Essential hypertension    History of back surgery    Screening for prostate cancer    Screening for diabetes mellitus    Umbilical hernia without obstruction and without gangrene  This visit was a preventative care visit, also known as wellness visit or routine physical.   Topics typically include healthy lifestyle, diet, exercise, preventative care, vaccinations, sick and well care, proper use of emergency dept and after hours care, as well as other concerns.     Separate significant issues discussed: Hypertension managed by cardiology, continue current medications  Hyperlipidemia-continue current medication, nonfasting today.  He will get his labs later, he is fasting  Vitamin D deficiency-labs today, continue supplement    General Recommendations: Continue to return yearly for your annual wellness and preventative care visits.  This gives [place] a chance to discuss healthy lifestyle, exercise, vaccinations, review your chart record, and perform screenings where appropriate.  I recommend you see your eye doctor yearly for routine vision care.  I recommend you see your dentist yearly for routine dental care including hygiene visits twice yearly.   Vaccination  Immunization History  Administered Date(s) Administered   Influenza,inj,Quad PF,6+ Mos 08/14/2019   Influenza-Unspecified 08/20/2021   PPD Test 11/19/2001   Tdap 11/18/2009, 01/18/2013   Zoster Recombinant(Shingrix) 07/09/2020, 11/11/2020    Counseled  on the Tdap (tetanus, diptheria, and acellular pertussis) vaccine.  Vaccine information sheet given. Tdap vaccine given after consent obtained.    Screening for cancer: Colon cancer screening: We will refer you for screening colonoscopy  Prostate Cancer screening: The recommended prostate cancer screening test is a blood test called the prostate-specific antigen (PSA) test. PSA is a protein that is made in the prostate. As you age, your prostate naturally produces more PSA. Abnormally high PSA levels may be caused by: Prostate cancer. An enlarged prostate that is not caused by cancer (benign prostatic hyperplasia, or BPH). This condition is very common in older men. A prostate gland infection (prostatitis) or urinary tract infection. Certain medicines such as male hormones (like testosterone) or other medicines that raise testosterone levels. A rectal exam may be done as part of prostate cancer screening to help provide information about the size of your prostate gland. When a rectal exam is performed, it should be done after the PSA level is drawn to avoid any effect on the results.   Skin cancer screening: Check your skin regularly for new changes, growing lesions, or other lesions of concern Come in for evaluation if you have skin lesions of concern.   Lung cancer screening: If you have a greater than 20 pack year history of tobacco use, then you may qualify for lung cancer screening with a chest CT scan.   Please call your insurance company to inquire about coverage for this test.   Pancreatic cancer:  no current screening test is available or routinely recommended. (risk factors: smoking, overweight or obese, diabetes, chronic pancreatitis, work exposure - dry cleaning, metal working, 57yo>, M>F, Tree surgeon, family hx/o, hereditary breast, ovarian, melanoma, lynch, peutz-jeghers).  Symptoms: jaundice, dark urine, light color or greasy stools, itchy skin, belly or back pain,  weight loss, poor appetite, nause, vomiting, liver enlargement, DVT/blood clots.   We currently don't have screenings for other cancers besides breast, cervical, colon, and lung cancers.  If you have a strong family history of cancer or have other cancer screening concerns, please let me know.  Genetic testing referral is an option for individuals with high cancer risk in the family.  There are some other cancer screenings in development currently.   Bone health: Get at least 150 minutes of aerobic exercise weekly Get weight bearing exercise at least once weekly Bone density test:  A bone density test is an imaging test that uses a type of X-ray to measure the amount of calcium and other minerals in your bones. The test may be used to diagnose or screen you for a condition that causes weak or thin bones (osteoporosis), predict your risk for a broken bone (fracture), or determine how well your osteoporosis treatment is working. The bone density test is recommended for females 65 and older, or females or males <65 if certain risk factors such as thyroid disease, long term use of steroids such as for asthma or rheumatological issues, vitamin D deficiency, estrogen deficiency, family history of osteoporosis, self or family history of fragility fracture in first degree relative.    Heart health: Get at least 150 minutes of aerobic exercise weekly Limit alcohol It is important to maintain a healthy blood pressure and healthy cholesterol numbers  Heart disease screening: Screening for heart disease includes screening for blood pressure, fasting lipids, glucose/diabetes screening, BMI height to weight ratio, reviewed of smoking status, physical activity, and diet.    Goals include blood pressure 120/80 or less, maintaining a healthy lipid/cholesterol profile, preventing diabetes or keeping diabetes numbers under good control, not smoking or using tobacco products, exercising most days per week or at  least 150 minutes per week of exercise, and eating healthy variety of fruits and vegetables, healthy oils, and avoiding unhealthy food choices like fried food, fast food, high sugar and high cholesterol foods.    Other tests may possibly include EKG test, CT coronary calcium score, echocardiogram, exercise treadmill stress test.       Vascular disease screening: For higher risk individuals including smokers, diabetics, patients with known heart disease or high blood pressure, kidney disease, and others, screening for vascular disease or atherosclerosis of the arteries is available.  Examples may include carotid ultrasound, abdominal aortic ultrasound, ABI blood flow screening in the legs, thoracic aorta screening.    Medical care options: I recommend you continue to seek care here first for routine care.  We try really hard to have available appointments Monday through Friday daytime hours for sick visits, acute visits, and physicals.  Urgent care should be used for after hours and weekends for significant issues that cannot wait till the next day.  The emergency department should be used for significant potentially life-threatening emergencies.  The emergency department is expensive, can often have long wait times for less significant concerns, so try to utilize primary care, urgent care, or telemedicine when possible to avoid unnecessary trips to the emergency department.  Virtual visits and telemedicine have been introduced since the pandemic started in 2020, and can be convenient ways to receive medical care.  We offer virtual appointments as well to assist you in a variety of options to seek medical care.   Legal  Take the time to do a last will and testament, Advanced Directives including Health Care Power of Attorney and Living Will documents.  Don't leave your family with burdens that can be handled ahead of time.   Advanced Directives: I recommend you consider completing a Health Care  Power of Attorney and Living Will.   These documents respect your wishes and help alleviate burdens on your loved ones if you were to become terminally ill or be in a position to need those documents enforced.    You can complete Advanced Directives yourself, have them notarized, then have copies made for our office, for you and for anybody you feel should have them in safe keeping.  Or, you can have an attorney prepare these documents.   If you haven't updated your Last Will and Testament in a while, it may be worthwhile having an attorney prepare these documents together and save on some costs.       Spiritual and Emotional Health Keeping a healthy spiritual life can help you better manage your physical health. Your spiritual life can help you to cope with any issues that may arise with your physical health.  Balance can keep [place] healthy and help [place] to recover.  If you are struggling with your spiritual health there are questions that you may want to ask yourself:  What makes me feel most complete? When do I feel most connected to the rest of the world? Where do I find the most inner strength? What am I doing when I feel whole?  Helpful tips: Being in nature. Some people feel very connected and at peace when they are walking outdoors or are outside. Helping others. Some feel the largest sense of wellbeing when they are of service to others. Being of service can take on many forms. It can be doing volunteer work, being kind to strangers, or offering a hand to a friend in need. Gratitude. Some people find they feel the most connected when they remain grateful. They may make lists of all the things they are grateful for or say a thank you out loud for all they have.    Emotional Health Are you in tune with your emotional health?  Check out this link: http://www.marquez-love.com/    Financial Health Make sure you use a budget for your personal finances Make sure you are insured  against risks (health insurance, life insurance, auto insurance, etc) Save more, spend less Set financial goals If you need help in this area, good resources include counseling through [employer] or other community resources, have a meeting with a Social research officer, government, and a good resource is the [employer] was seen today for nonfasting cpe.  Diagnoses and all orders for this visit:  Encounter for health maintenance examination in adult -     Comprehensive metabolic panel -     CBC with Differential/Platelet -     PSA, total and free -     Hemoglobin A1c -     POCT Urinalysis DIP (Proadvantage Device) -     VITAMIN D 25 Hydroxy (Vit-D Deficiency, Fractures)  Need for Tdap vaccination  Screen for colon cancer -     Ambulatory referral to Gastroenterology  Vitamin D deficiency -     VITAMIN D 25 Hydroxy (Vit-D Deficiency, Fractures)  Mixed hyperlipidemia  Family history of early CAD  Essential hypertension  History of back surgery  Screening for prostate cancer -     PSA, total and free  Screening for diabetes mellitus -     Hemoglobin A1c  Umbilical hernia without obstruction and without gangrene     Follow-up pending labs, yearly for physical

## 2023-04-16 ENCOUNTER — Other Ambulatory Visit (HOSPITAL_COMMUNITY): Payer: Self-pay

## 2023-04-16 ENCOUNTER — Other Ambulatory Visit: Payer: Self-pay

## 2023-04-16 LAB — COMPREHENSIVE METABOLIC PANEL
ALT: 33 IU/L (ref 0–44)
AST: 27 IU/L (ref 0–40)
Albumin: 4.6 g/dL (ref 3.8–4.9)
Alkaline Phosphatase: 111 IU/L (ref 44–121)
Bilirubin Total: 0.4 mg/dL (ref 0.0–1.2)
CO2: 25 mmol/L (ref 20–29)
Calcium: 10.4 mg/dL — ABNORMAL HIGH (ref 8.7–10.2)
Chloride: 100 mmol/L (ref 96–106)
Creatinine, Ser: 1.15 mg/dL (ref 0.76–1.27)
Potassium: 4.2 mmol/L (ref 3.5–5.2)
Sodium: 140 mmol/L (ref 134–144)
eGFR: 74 mL/min/{1.73_m2} (ref >59)

## 2023-04-16 LAB — CBC WITH DIFFERENTIAL/PLATELET
Basos: 1 %
EOS (ABSOLUTE): 0.3 10*3/uL (ref 0.0–0.4)
Eos: 3 %
Hematocrit: 44.8 % (ref 37.5–51.0)
Immature Granulocytes: 0 %
Lymphocytes Absolute: 2.4 10*3/uL (ref 0.7–3.1)
Lymphs: 29 %
MCH: 30.7 pg (ref 26.6–33.0)
MCHC: 33 g/dL (ref 31.5–35.7)
MCV: 93 fL (ref 79–97)
RBC: 4.82 x10E6/uL (ref 4.14–5.80)
RDW: 12.6 % (ref 11.6–15.4)
WBC: 8.1 10*3/uL (ref 3.4–10.8)

## 2023-04-16 LAB — PSA, TOTAL AND FREE
PSA, Free: 0.23 ng/mL
Prostate Specific Ag, Serum: 0.8 ng/mL (ref 0.0–4.0)

## 2023-04-16 LAB — HEMOGLOBIN A1C
Est. average glucose Bld gHb Est-mCnc: 117 mg/dL
Hgb A1c MFr Bld: 5.7 % — ABNORMAL HIGH (ref 4.8–5.6)

## 2023-04-16 LAB — VITAMIN D 25 HYDROXY (VIT D DEFICIENCY, FRACTURES): Vit D, 25-Hydroxy: 44.6 ng/mL (ref 30.0–100.0)

## 2023-04-16 NOTE — Progress Notes (Signed)
Results sent through MyChart

## 2023-04-19 ENCOUNTER — Encounter: Payer: Self-pay | Admitting: Gastroenterology

## 2023-04-20 ENCOUNTER — Other Ambulatory Visit (HOSPITAL_COMMUNITY): Payer: Self-pay

## 2023-04-22 ENCOUNTER — Other Ambulatory Visit: Payer: Self-pay

## 2023-04-22 ENCOUNTER — Other Ambulatory Visit (HOSPITAL_COMMUNITY): Payer: Self-pay

## 2023-04-22 MED ORDER — ATORVASTATIN CALCIUM 10 MG PO TABS
10.0000 mg | ORAL_TABLET | Freq: Every day | ORAL | 3 refills | Status: AC
  Filled 2023-04-22 (×2): qty 90, 90d supply, fill #0
  Filled 2023-07-17: qty 90, 90d supply, fill #1
  Filled 2023-10-19: qty 90, 90d supply, fill #2
  Filled 2024-01-17: qty 90, 90d supply, fill #3
  Filled 2024-01-20: qty 90, 90d supply, fill #0

## 2023-05-07 ENCOUNTER — Encounter: Payer: Self-pay | Admitting: Gastroenterology

## 2023-06-11 ENCOUNTER — Other Ambulatory Visit (HOSPITAL_COMMUNITY): Payer: Self-pay

## 2023-06-11 ENCOUNTER — Encounter: Payer: Self-pay | Admitting: Gastroenterology

## 2023-06-11 ENCOUNTER — Ambulatory Visit (AMBULATORY_SURGERY_CENTER): Payer: 59

## 2023-06-11 VITALS — Ht 74.0 in | Wt 238.0 lb

## 2023-06-11 DIAGNOSIS — Z86010 Personal history of colonic polyps: Secondary | ICD-10-CM

## 2023-06-11 MED ORDER — NA SULFATE-K SULFATE-MG SULF 17.5-3.13-1.6 GM/177ML PO SOLN
1.0000 | Freq: Once | ORAL | 0 refills | Status: AC
  Filled 2023-06-11: qty 354, 1d supply, fill #0

## 2023-06-11 NOTE — Progress Notes (Signed)
No egg or soy allergy known to patient  No issues known to pt with past sedation with any surgeries or procedures Patient denies ever being told they had issues or difficulty with intubation  No FH of Malignant Hyperthermia Pt is not on diet pills Pt is not on  home 02  Pt is not on blood thinners  Pt denies issues with constipation  No A fib or A flutter Have any cardiac testing pending--no Patient's chart reviewed by John Nulty CNRA prior to previsit and patient appropriate for the [facility].  Previsit completed and red dot placed by patient's name on their procedure day (on provider's schedule).    Pt instructed to use Singlecare.com or GoodRx for a price reduction on prep  Ambulates independently. 

## 2023-06-12 ENCOUNTER — Other Ambulatory Visit (HOSPITAL_COMMUNITY): Payer: Self-pay

## 2023-06-20 ENCOUNTER — Other Ambulatory Visit (HOSPITAL_COMMUNITY): Payer: Self-pay

## 2023-06-25 ENCOUNTER — Ambulatory Visit (AMBULATORY_SURGERY_CENTER): Payer: 59 | Admitting: Gastroenterology

## 2023-06-25 ENCOUNTER — Encounter: Payer: Self-pay | Admitting: Gastroenterology

## 2023-06-25 VITALS — BP 126/82 | HR 65 | Temp 98.6°F | Resp 21 | Ht 74.0 in | Wt 238.0 lb

## 2023-06-25 DIAGNOSIS — D120 Benign neoplasm of cecum: Secondary | ICD-10-CM

## 2023-06-25 DIAGNOSIS — D122 Benign neoplasm of ascending colon: Secondary | ICD-10-CM | POA: Diagnosis not present

## 2023-06-25 DIAGNOSIS — Z09 Encounter for follow-up examination after completed treatment for conditions other than malignant neoplasm: Secondary | ICD-10-CM

## 2023-06-25 DIAGNOSIS — D123 Benign neoplasm of transverse colon: Secondary | ICD-10-CM

## 2023-06-25 DIAGNOSIS — I10 Essential (primary) hypertension: Secondary | ICD-10-CM | POA: Diagnosis not present

## 2023-06-25 DIAGNOSIS — Z86010 Personal history of colonic polyps: Secondary | ICD-10-CM

## 2023-06-25 DIAGNOSIS — Z1211 Encounter for screening for malignant neoplasm of colon: Secondary | ICD-10-CM | POA: Diagnosis not present

## 2023-06-25 MED ORDER — SODIUM CHLORIDE 0.9 % IV SOLN: 500.0000 mL | Freq: Once | INTRAVENOUS | Status: DC

## 2023-06-25 NOTE — Op Note (Signed)
[facility] Patient Name: Patrick Byrd Procedure Date: 06/25/2023 2:11 PM MRN: [id number] Endoscopist: Napoleon Form , MD, [id number] Age: 57 Referring MD:  Date of Birth: 07-Aug-1966 Gender: Male Account #: [id number] Procedure:                Colonoscopy Indications:              High risk colon cancer surveillance: Personal                            history of colonic polyps, High risk colon cancer                            surveillance: Personal history of multiple (3 or                            more) adenomas, Last colonoscopy: 2018 Medicines:                Monitored Anesthesia Care Procedure:                Pre-Anesthesia Assessment:                           - Prior to the procedure, a History and Physical                            was performed, and patient medications and                            allergies were reviewed. The patient's tolerance of                            previous anesthesia was also reviewed. The risks                            and benefits of the procedure and the sedation                            options and risks were discussed with the patient.                            All questions were answered, and informed consent                            was obtained. Prior Anticoagulants: The patient has                            taken no anticoagulant or antiplatelet agents. ASA                            Grade Assessment: II - A patient with mild systemic                            disease. After reviewing the risks and benefits,  the patient was deemed in satisfactory condition to                            undergo the procedure.                           After obtaining informed consent, the colonoscope                            was passed under direct vision. Throughout the                            procedure, the patient's blood pressure, pulse, and                            oxygen  saturations were monitored continuously. The                            Olympus PCF-H190DL (#[id number]) Colonoscope was                            introduced through the anus and advanced to the the                            cecum, identified by appendiceal orifice and                            ileocecal valve. The colonoscopy was performed                            without difficulty. The patient tolerated the                            procedure well. The quality of the bowel                            preparation was good. The ileocecal valve,                            appendiceal orifice, and rectum were photographed. Scope In: 2:23:19 PM Scope Out: 2:37:05 PM Scope Withdrawal Time: 0 hours 8 minutes 49 seconds  Total Procedure Duration: 0 hours 13 minutes 46 seconds  Findings:                 The perianal and digital rectal examinations were                            normal.                           Seven sessile polyps were found in the transverse                            colon, ascending colon and cecum. The polyps were 3  to 9 mm in size. These polyps were removed with a                            cold snare. Resection and retrieval were complete.                           A few small-mouthed diverticula were found in the                            sigmoid colon.                           Non-bleeding external and internal hemorrhoids were                            found during retroflexion. The hemorrhoids were                            medium-sized. Complications:            No immediate complications. Estimated Blood Loss:     Estimated blood loss was minimal. Impression:               - Seven 3 to 9 mm polyps in the transverse colon,                            in the ascending colon and in the cecum, removed                            with a cold snare. Resected and retrieved.                           - Diverticulosis in the sigmoid colon.                            - Non-bleeding external and internal hemorrhoids. Recommendation:           - Patient has a contact number available for                            emergencies. The signs and symptoms of potential                            delayed complications were discussed with the                            patient. Return to normal activities tomorrow.                            Written discharge instructions were provided to the                            patient.                           - Resume previous diet.                           -  Continue present medications.                           - Await pathology results.                           - Repeat colonoscopy in 3 years for surveillance. Napoleon Form, MD 06/25/2023 2:48:21 PM This report has been signed electronically.

## 2023-06-25 NOTE — Patient Instructions (Addendum)
-   Resume previous diet. - Continue present medications. - Await pathology results. - Repeat colonoscopy in 3 years for surveillance.  YOU HAD AN ENDOSCOPIC PROCEDURE TODAY AT THE [facility]:   Refer to the procedure report that was given to you for any specific questions about what was found during the examination.  If the procedure report does not answer your questions, please call your gastroenterologist to clarify.  If you requested that your care partner not be given the details of your procedure findings, then the procedure report has been included in a sealed envelope for you to review at your convenience later.  YOU SHOULD EXPECT: Some feelings of bloating in the abdomen. Passage of more gas than usual.  Walking can help get rid of the air that was put into your GI tract during the procedure and reduce the bloating. If you had a lower endoscopy (such as a colonoscopy or flexible sigmoidoscopy) you may notice spotting of blood in your stool or on the toilet paper. If you underwent a bowel prep for your procedure, you may not have a normal bowel movement for a few days.  Please Note:  You might notice some irritation and congestion in your nose or some drainage.  This is from the oxygen used during your procedure.  There is no need for concern and it should clear up in a day or so.  SYMPTOMS TO REPORT IMMEDIATELY:  Following lower endoscopy (colonoscopy or flexible sigmoidoscopy):  Excessive amounts of blood in the stool  Significant tenderness or worsening of abdominal pains  Swelling of the abdomen that is new, acute  Fever of 100F or higher  For urgent or emergent issues, a gastroenterologist can be reached at any hour by calling [phone]. Do not use MyChart messaging for urgent concerns.    DIET:  We do recommend a small meal at first, but then you may proceed to your regular diet.  Drink plenty of fluids but you should avoid alcoholic beverages for 24  hours.  ACTIVITY:  You should plan to take it easy for the rest of today and you should NOT DRIVE or use heavy machinery until tomorrow (because of the sedation medicines used during the test).    FOLLOW UP: Our staff will call the number listed on your records the next business day following your procedure.  We will call around 7:15- 8:00 am to check on you and address any questions or concerns that you may have regarding the information given to you following your procedure. If we do not reach you, we will leave a message.     If any biopsies were taken you will be contacted by phone or by letter within the next 1-3 weeks.  Please call us at [phone] if you have not heard about the biopsies in 3 weeks.    SIGNATURES/CONFIDENTIALITY: You and/or your care partner have signed paperwork which will be entered into your electronic medical record.  These signatures attest to the fact that that the information above on your After Visit Summary has been reviewed and is understood.  Full responsibility of the confidentiality of this discharge information lies with you and/or your care-partner.

## 2023-06-25 NOTE — Progress Notes (Signed)
Report to PACU, RN, vss, BBS= Clear.  

## 2023-06-25 NOTE — Progress Notes (Unsigned)
Called to room to assist during endoscopic procedure.  Patient ID and intended procedure confirmed with present staff. Received instructions for my participation in the procedure from the performing physician.  

## 2023-06-25 NOTE — Progress Notes (Unsigned)
I have reviewed the patient's medical history in detail and updated the computerized patient record.

## 2023-06-25 NOTE — Progress Notes (Unsigned)
[facility] History and Physical   Primary Care Physician:  Ronnald Nian, MD   Reason for Procedure:  History of adenomatous colon polyps  Plan:    Surveillance colonoscopy with possible interventions as needed     HPI: Patrick Byrd. is a very pleasant 57 y.o. male here for surveillance colonoscopy. Denies any nausea, vomiting, abdominal pain, melena or bright red blood per rectum  The risks and benefits as well as alternatives of endoscopic procedure(s) have been discussed and reviewed. All questions answered. The patient agrees to proceed.    Past Medical History:  Diagnosis Date   Allergic rhinitis    Allergy    ED (erectile dysfunction)    Family history of premature CAD    History of cardiovascular stress test 12/16/2009   Myoview, nonischemic; Dr. Allyson Sabal   Hyperlipidemia    Hypertension 2023   pt denies   Mixed dyslipidemia    Obesity    Rosacea    sees dermatology   Vitamin D deficiency     Past Surgical History:  Procedure Laterality Date   COLONOSCOPY  02/2017   Dr. Marsa Aris   LUMBAR DISC SURGERY     [facility] x2    Prior to Admission medications   Medication Sig Start Date End Date Taking? Authorizing Provider  amLODipine (NORVASC) 5 MG tablet Take 1 tablet (5 mg total) by mouth daily. 12/24/22  Yes   APPLE CIDER VINEGAR PO Take by mouth.   Yes [provider]  Ascorbic Acid (VITAMIN C) 1000 MG tablet Take 1,000 mg by mouth daily.   Yes [provider]  aspirin 81 MG tablet Take 81 mg by mouth daily.   Yes [provider]  atorvastatin (LIPITOR) 10 MG tablet Take 1 tablet (10 mg) by mouth daily. 04/22/23  Yes   Cholecalciferol (VITAMIN D3) 50 MCG (2000 UT) TABS Take 1 tablet by mouth daily.   Yes [provider]  irbesartan (AVAPRO) 300 MG tablet Take 1 tablet (300 mg total) by mouth daily. 10/22/22  Yes   loratadine (CLARITIN) 10 MG tablet Take 10 mg by mouth daily.   Yes [provider]  Multiple Vitamins-Minerals (MULTIVITAMIN WITH MINERALS) tablet Take 1 tablet by mouth daily.     Yes [provider]  Omega-3 Fatty Acids (FISH OIL) 1000 MG CAPS Take 1,200 mg by mouth daily.   Yes [provider]  cholecalciferol (VITAMIN D) 1000 units tablet Take 1,000 Units by mouth daily.    [provider]  multivitamin-lutein (OCUVITE-LUTEIN) CAPS capsule Take 1 capsule by mouth daily.    [provider]    Current Outpatient Medications  Medication Sig Dispense Refill   amLODipine (NORVASC) 5 MG tablet Take 1 tablet (5 mg total) by mouth daily. 90 tablet 3   APPLE CIDER VINEGAR PO Take by mouth.     Ascorbic Acid (VITAMIN C) 1000 MG tablet Take 1,000 mg by mouth daily.     aspirin 81 MG tablet Take 81 mg by mouth daily.     atorvastatin (LIPITOR) 10 MG tablet Take 1 tablet (10 mg) by mouth daily. 90 tablet 3   Cholecalciferol (VITAMIN D3) 50 MCG (2000 UT) TABS Take 1 tablet by mouth daily.     irbesartan (AVAPRO) 300 MG tablet Take 1 tablet (300 mg total) by mouth daily. 90 tablet 3   loratadine (CLARITIN) 10 MG tablet Take 10 mg by mouth daily.     Multiple Vitamins-Minerals (MULTIVITAMIN WITH MINERALS)  tablet Take 1 tablet by mouth daily.       Omega-3 Fatty Acids (FISH OIL) 1000 MG CAPS Take 1,200 mg by mouth daily.     cholecalciferol (VITAMIN D) 1000 units tablet Take 1,000 Units by mouth daily.     multivitamin-lutein (OCUVITE-LUTEIN) CAPS capsule Take 1 capsule by mouth daily.     Current Facility-Administered Medications  Medication Dose Route Frequency Provider Last Rate Last Admin   0.9 %  sodium chloride infusion  500 mL Intravenous Once Napoleon Form, MD        Allergies as of 06/25/2023 - Review Complete 06/25/2023  Allergen Reaction Noted   Statins Other (See Comments) 06/08/2014    Family History  Problem Relation Age of Onset   Cancer Mother        skin   Pancreatic cancer Father    Hypertension  Father    Heart disease Father 44       CABG   Arthritis Sister    Diabetes Paternal Aunt    Heart disease Paternal Uncle 25       MI   Heart disease Paternal Uncle 2       MI   Cancer Paternal Grandmother        brain   Arthritis Paternal Grandmother    Stroke Paternal Grandfather    Colon cancer Neg Hx    Rectal cancer Neg Hx    Stomach cancer Neg Hx    Esophageal cancer Neg Hx     Social History   Socioeconomic History   Marital status: Married    Spouse name: Not on file   Number of children: Not on file   Years of education: Not on file   Highest education level: Not on file  Occupational History   Not on file  Tobacco Use   Smoking status: Never   Smokeless tobacco: Never  Vaping Use   Vaping status: Never Used  Substance and Sexual Activity   Alcohol use: Yes    Alcohol/week: 4.0 standard drinks of alcohol    Types: 4 Shots of liquor per week    Comment: social; "[employer]"   Drug use: No   Sexual activity: Not on file  Other Topics Concern   Not on file  Social History Narrative   ** Merged History Encounter ** Married, retired, prior worked in Brewing technologist, Patent attorney.  3 children.  Exercise - walk, cycling 10 miles every other day, dumbells, mowing the yard.  Lives primarily in [place], has a house in [place]. Wife works at [employer] as a Engineer, civil (consulting). 04/2023.   Social Determinants of Health   Financial Resource Strain: Not on file  Food Insecurity: Not on file  Transportation Needs: Not on file  Physical Activity: Not on file  Stress: Not on file  Social Connections: Not on file  Intimate Partner Violence: Not on file    Review of Systems:  All other review of systems negative except as mentioned in the HPI.  Physical Exam: Vital signs in last 24 hours: BP 130/84   Pulse 73   Temp 98.6 F (37 C)   Resp 10   Ht 6\' 2"  (1.88 m)   Wt 238 lb (108 kg)   SpO2 99%   BMI 30.56 kg/m  General:   Alert, NAD Lungs:  Clear .    Heart:  Regular rate and rhythm Abdomen:  Soft, nontender and nondistended. Neuro/Psych:  Alert and cooperative. Normal mood and affect. A and  O x 3  Reviewed labs, radiology imaging, old records and pertinent past GI work up  Patient is appropriate for planned procedure(s) and anesthesia in an ambulatory setting   K. Scherry Ran , MD [phone]

## 2023-06-26 ENCOUNTER — Telehealth: Payer: Self-pay | Admitting: *Deleted

## 2023-06-26 NOTE — Telephone Encounter (Signed)
  Follow up Call-     06/25/2023    1:38 PM  Call back number  Post procedure Call Back phone  # [phone]  Permission to leave phone message Yes     Patient questions:  Do you have a fever, pain , or abdominal swelling? No. Pain Score  0 *  Have you tolerated food without any problems? Yes.    Have you been able to return to your normal activities? Yes.    Do you have any questions about your discharge instructions: Diet   No. Medications  No. Follow up visit  No.  Do you have questions or concerns about your Care? No.  Actions: * If pain score is 4 or above: No action needed, pain <4.

## 2023-07-01 LAB — SURGICAL PATHOLOGY

## 2023-07-02 ENCOUNTER — Encounter: Payer: Self-pay | Admitting: Gastroenterology

## 2023-07-05 ENCOUNTER — Encounter: Payer: No Typology Code available for payment source | Admitting: Gastroenterology

## 2023-07-17 ENCOUNTER — Other Ambulatory Visit (HOSPITAL_COMMUNITY): Payer: Self-pay

## 2023-08-19 DIAGNOSIS — R0789 Other chest pain: Secondary | ICD-10-CM | POA: Diagnosis not present

## 2023-08-19 DIAGNOSIS — E781 Pure hyperglyceridemia: Secondary | ICD-10-CM | POA: Diagnosis not present

## 2023-08-19 DIAGNOSIS — I10 Essential (primary) hypertension: Secondary | ICD-10-CM | POA: Diagnosis not present

## 2023-10-19 ENCOUNTER — Other Ambulatory Visit (HOSPITAL_COMMUNITY): Payer: Self-pay

## 2023-10-21 ENCOUNTER — Other Ambulatory Visit: Payer: Self-pay

## 2023-10-21 ENCOUNTER — Other Ambulatory Visit (HOSPITAL_COMMUNITY): Payer: Self-pay

## 2023-10-21 MED ORDER — IRBESARTAN 300 MG PO TABS
300.0000 mg | ORAL_TABLET | Freq: Every day | ORAL | 3 refills | Status: AC
  Filled 2023-10-21: qty 90, 90d supply, fill #0
  Filled 2024-01-17: qty 90, 90d supply, fill #1

## 2023-11-22 DIAGNOSIS — H524 Presbyopia: Secondary | ICD-10-CM | POA: Diagnosis not present

## 2023-11-26 DIAGNOSIS — D485 Neoplasm of uncertain behavior of skin: Secondary | ICD-10-CM | POA: Diagnosis not present

## 2023-11-26 DIAGNOSIS — D1809 Hemangioma of other sites: Secondary | ICD-10-CM | POA: Diagnosis not present

## 2023-11-26 DIAGNOSIS — L570 Actinic keratosis: Secondary | ICD-10-CM | POA: Diagnosis not present

## 2023-12-03 ENCOUNTER — Encounter: Payer: Self-pay | Admitting: Internal Medicine

## 2024-01-17 ENCOUNTER — Other Ambulatory Visit (HOSPITAL_COMMUNITY): Payer: Self-pay

## 2024-01-17 MED ORDER — AMLODIPINE BESYLATE 5 MG PO TABS
5.0000 mg | ORAL_TABLET | Freq: Every day | ORAL | 3 refills | Status: AC
  Filled 2024-01-17: qty 90, 90d supply, fill #0

## 2024-01-20 ENCOUNTER — Other Ambulatory Visit (HOSPITAL_COMMUNITY): Payer: Self-pay

## 2024-01-22 ENCOUNTER — Other Ambulatory Visit (HOSPITAL_COMMUNITY): Payer: Self-pay

## 2024-04-01 ENCOUNTER — Other Ambulatory Visit (HOSPITAL_COMMUNITY): Payer: Self-pay
